# Patient Record
Sex: Female | Born: 1955 | Race: White | Hispanic: No | Marital: Married | Smoking: Never smoker
Health system: Southern US, Community
[De-identification: ages and names within clinical notes are randomized; demographics above are authoritative.]

## PROBLEM LIST (undated history)

## (undated) DIAGNOSIS — F32A Depression, unspecified: Secondary | ICD-10-CM

## (undated) HISTORY — PX: AUGMENTATION MAMMAPLASTY: SUR837

---

## 2006-01-11 ENCOUNTER — Other Ambulatory Visit: Admission: RE | Admit: 2006-01-11 | Discharge: 2006-01-11 | Payer: Self-pay | Admitting: Obstetrics and Gynecology

## 2006-01-25 ENCOUNTER — Ambulatory Visit (HOSPITAL_BASED_OUTPATIENT_CLINIC_OR_DEPARTMENT_OTHER): Admission: RE | Admit: 2006-01-25 | Discharge: 2006-01-25 | Payer: Self-pay | Admitting: Orthopedic Surgery

## 2007-04-27 ENCOUNTER — Other Ambulatory Visit: Admission: RE | Admit: 2007-04-27 | Discharge: 2007-04-27 | Payer: Self-pay | Admitting: Obstetrics and Gynecology

## 2007-05-15 ENCOUNTER — Encounter: Admission: RE | Admit: 2007-05-15 | Discharge: 2007-05-15 | Payer: Self-pay | Admitting: Obstetrics and Gynecology

## 2007-05-29 ENCOUNTER — Encounter: Admission: RE | Admit: 2007-05-29 | Discharge: 2007-05-29 | Payer: Self-pay | Admitting: Obstetrics and Gynecology

## 2008-06-04 ENCOUNTER — Encounter: Admission: RE | Admit: 2008-06-04 | Discharge: 2008-06-04 | Payer: Self-pay | Admitting: Obstetrics and Gynecology

## 2008-06-06 ENCOUNTER — Other Ambulatory Visit: Admission: RE | Admit: 2008-06-06 | Discharge: 2008-06-06 | Payer: Self-pay | Admitting: Obstetrics and Gynecology

## 2008-06-16 ENCOUNTER — Encounter: Admission: RE | Admit: 2008-06-16 | Discharge: 2008-06-16 | Payer: Self-pay | Admitting: Obstetrics and Gynecology

## 2009-08-04 ENCOUNTER — Encounter: Admission: RE | Admit: 2009-08-04 | Discharge: 2009-08-04 | Payer: Self-pay | Admitting: Obstetrics and Gynecology

## 2009-08-11 ENCOUNTER — Other Ambulatory Visit: Admission: RE | Admit: 2009-08-11 | Discharge: 2009-08-11 | Payer: Self-pay | Admitting: Obstetrics and Gynecology

## 2010-05-16 ENCOUNTER — Encounter: Payer: Self-pay | Admitting: Obstetrics and Gynecology

## 2010-08-18 ENCOUNTER — Other Ambulatory Visit (HOSPITAL_COMMUNITY)
Admission: RE | Admit: 2010-08-18 | Discharge: 2010-08-18 | Disposition: A | Discharge status: Home / Self Care | Payer: 59 | Source: Ambulatory Visit | Attending: Obstetrics and Gynecology | Admitting: Obstetrics and Gynecology

## 2010-08-18 ENCOUNTER — Other Ambulatory Visit: Payer: Self-pay | Admitting: Obstetrics and Gynecology

## 2010-08-18 DIAGNOSIS — Z01419 Encounter for gynecological examination (general) (routine) without abnormal findings: Secondary | ICD-10-CM | POA: Insufficient documentation

## 2010-09-07 ENCOUNTER — Other Ambulatory Visit: Payer: Self-pay | Admitting: Obstetrics and Gynecology

## 2010-09-07 DIAGNOSIS — Z1231 Encounter for screening mammogram for malignant neoplasm of breast: Secondary | ICD-10-CM

## 2010-09-10 ENCOUNTER — Ambulatory Visit
Admission: RE | Admit: 2010-09-10 | Discharge: 2010-09-10 | Disposition: A | Discharge status: Home / Self Care | Payer: 59 | Source: Ambulatory Visit | Attending: Obstetrics and Gynecology | Admitting: Obstetrics and Gynecology

## 2010-09-10 DIAGNOSIS — Z1231 Encounter for screening mammogram for malignant neoplasm of breast: Secondary | ICD-10-CM

## 2010-09-10 NOTE — Op Note (Signed)
Brittany Perry, Brittany Perry             ACCOUNT NO.:  [id number]   MEDICAL RECORD NO.:  [id number]          PATIENT TYPE:  AMB   LOCATION:  DSC                          FACILITY:  [facility]   PHYSICIAN:  Mila Homer. Sherlean Foot, M.D. DATE OF BIRTH:  25-Dec-1955   DATE OF PROCEDURE:  01/25/2006  DATE OF DISCHARGE:                                 OPERATIVE REPORT   PREOPERATIVE DIAGNOSIS:  Left shoulder impingement syndrome.   POSTOPERATIVE DIAGNOSIS:  Left shoulder impingement syndrome.   PROCEDURES:  Left shoulder arthroscopy, debridement of an anterior labral  tear, and biceps fraying, as well as a subacromial decompression.   SURGEON:  Mila Homer. Sherlean Foot, M.D.   ASSISTANT:  None.   ANESTHESIA:  General.   COMPLICATIONS:  None.   DRAINS:  None.   INDICATIONS FOR PROCEDURE:  The patient is a 55 year old white female with  failure of conservative measures for osteoarthritis of the knee.  Informed  consent was obtained.   DESCRIPTION OF PROCEDURE:  The patient was laid supine, administered general  anesthesia, and then placed in the beach-chair position.  The left shoulder  was prepped and draped in the usual sterile fashion in the beach-chair  position.  Anterior and posterior arthroscopic portals were created with a  #11 blade, blunt trocar and cannula.  Diagnostic arthroscopy of glenohumeral  joint revealed some anterior and superior labral fraying and tearing.  A  small Gator shaver was used through the anterior portal to debride this.  I  then went into the subacromial space through the posterior portal.  From the  direct lateral portal, I used a 4.4-mm cylindrical bur to perform an  aggressive anterolateral acromioplasty.  I did not violate the AC joint.  I  released the CA ligament with the Arthro Care debridement wand.  I performed  a bursectomy with the small Gator shaver.  This afforded excellent  decompression.  There was some small, dorsal-sided, partial-thickness  tearing of the  rotator cuff and a lot of hyperemia.  I then lavaged and  closed with 4-0 nylon sutures, dressed with Xeroform dressing, sterile  Webril and sterile ABDs and 2-inch silk tape and a simple sling.           ______________________________  Mila Homer. Sherlean Foot, M.D.     SDL/MEDQ  D:  01/25/2006  T:  01/25/2006  Job:  [id number]

## 2011-07-13 ENCOUNTER — Encounter (INDEPENDENT_AMBULATORY_CARE_PROVIDER_SITE_OTHER): Payer: 59 | Admitting: Ophthalmology

## 2011-09-06 ENCOUNTER — Other Ambulatory Visit (HOSPITAL_COMMUNITY)
Admission: RE | Admit: 2011-09-06 | Discharge: 2011-09-06 | Disposition: A | Discharge status: Home / Self Care | Payer: 59 | Source: Ambulatory Visit | Attending: Obstetrics and Gynecology | Admitting: Obstetrics and Gynecology

## 2011-09-06 ENCOUNTER — Other Ambulatory Visit: Payer: Self-pay | Admitting: Obstetrics and Gynecology

## 2011-09-06 DIAGNOSIS — Z01419 Encounter for gynecological examination (general) (routine) without abnormal findings: Secondary | ICD-10-CM | POA: Insufficient documentation

## 2011-12-30 ENCOUNTER — Other Ambulatory Visit: Payer: Self-pay | Admitting: Orthopedic Surgery

## 2011-12-30 DIAGNOSIS — R52 Pain, unspecified: Secondary | ICD-10-CM

## 2011-12-30 DIAGNOSIS — M25522 Pain in left elbow: Secondary | ICD-10-CM

## 2012-01-03 ENCOUNTER — Ambulatory Visit (INDEPENDENT_AMBULATORY_CARE_PROVIDER_SITE_OTHER): Payer: 59 | Admitting: Ophthalmology

## 2012-01-03 DIAGNOSIS — H3530 Unspecified macular degeneration: Secondary | ICD-10-CM

## 2012-01-03 DIAGNOSIS — H43819 Vitreous degeneration, unspecified eye: Secondary | ICD-10-CM

## 2012-01-03 DIAGNOSIS — H2510 Age-related nuclear cataract, unspecified eye: Secondary | ICD-10-CM

## 2012-01-05 ENCOUNTER — Ambulatory Visit
Admission: RE | Admit: 2012-01-05 | Discharge: 2012-01-05 | Disposition: A | Discharge status: Home / Self Care | Payer: 59 | Source: Ambulatory Visit | Attending: Orthopedic Surgery | Admitting: Orthopedic Surgery

## 2012-01-05 DIAGNOSIS — R52 Pain, unspecified: Secondary | ICD-10-CM

## 2012-01-05 DIAGNOSIS — M25522 Pain in left elbow: Secondary | ICD-10-CM

## 2012-01-05 MED ORDER — IOHEXOL 180 MG/ML  SOLN
5.0000 mL | Freq: Once | INTRAMUSCULAR | Status: AC | PRN
  Administered 2012-01-05: 5 mL via INTRA_ARTICULAR

## 2012-09-12 ENCOUNTER — Other Ambulatory Visit: Payer: Self-pay | Admitting: Obstetrics and Gynecology

## 2012-09-12 ENCOUNTER — Other Ambulatory Visit (HOSPITAL_COMMUNITY)
Admission: RE | Admit: 2012-09-12 | Discharge: 2012-09-12 | Disposition: A | Discharge status: Home / Self Care | Payer: 59 | Source: Ambulatory Visit | Attending: Obstetrics and Gynecology | Admitting: Obstetrics and Gynecology

## 2012-09-12 DIAGNOSIS — Z1151 Encounter for screening for human papillomavirus (HPV): Secondary | ICD-10-CM | POA: Insufficient documentation

## 2012-09-12 DIAGNOSIS — Z01419 Encounter for gynecological examination (general) (routine) without abnormal findings: Secondary | ICD-10-CM | POA: Insufficient documentation

## 2013-01-02 ENCOUNTER — Ambulatory Visit (INDEPENDENT_AMBULATORY_CARE_PROVIDER_SITE_OTHER): Payer: 59 | Admitting: Ophthalmology

## 2013-01-16 ENCOUNTER — Ambulatory Visit (INDEPENDENT_AMBULATORY_CARE_PROVIDER_SITE_OTHER): Payer: 59 | Admitting: Ophthalmology

## 2013-01-16 DIAGNOSIS — H2510 Age-related nuclear cataract, unspecified eye: Secondary | ICD-10-CM

## 2013-01-16 DIAGNOSIS — H3530 Unspecified macular degeneration: Secondary | ICD-10-CM

## 2013-01-16 DIAGNOSIS — H43819 Vitreous degeneration, unspecified eye: Secondary | ICD-10-CM

## 2013-02-21 ENCOUNTER — Other Ambulatory Visit: Payer: Self-pay

## 2013-02-21 DIAGNOSIS — Z1231 Encounter for screening mammogram for malignant neoplasm of breast: Secondary | ICD-10-CM

## 2013-03-18 ENCOUNTER — Other Ambulatory Visit: Payer: Self-pay | Admitting: Dermatology

## 2013-03-28 ENCOUNTER — Ambulatory Visit
Admission: RE | Admit: 2013-03-28 | Discharge: 2013-03-28 | Disposition: A | Discharge status: Home / Self Care | Payer: 59 | Source: Ambulatory Visit

## 2013-03-28 DIAGNOSIS — Z1231 Encounter for screening mammogram for malignant neoplasm of breast: Secondary | ICD-10-CM

## 2013-11-28 ENCOUNTER — Other Ambulatory Visit: Payer: Self-pay | Admitting: Internal Medicine

## 2013-11-28 DIAGNOSIS — Z1231 Encounter for screening mammogram for malignant neoplasm of breast: Secondary | ICD-10-CM

## 2014-01-23 ENCOUNTER — Ambulatory Visit (INDEPENDENT_AMBULATORY_CARE_PROVIDER_SITE_OTHER): Payer: 59 | Admitting: Ophthalmology

## 2014-03-12 ENCOUNTER — Ambulatory Visit (INDEPENDENT_AMBULATORY_CARE_PROVIDER_SITE_OTHER): Payer: 59 | Admitting: Ophthalmology

## 2014-03-12 DIAGNOSIS — H3531 Nonexudative age-related macular degeneration: Secondary | ICD-10-CM

## 2014-03-12 DIAGNOSIS — H43813 Vitreous degeneration, bilateral: Secondary | ICD-10-CM

## 2014-03-26 ENCOUNTER — Other Ambulatory Visit: Payer: Self-pay | Admitting: Obstetrics and Gynecology

## 2014-03-26 ENCOUNTER — Other Ambulatory Visit (HOSPITAL_COMMUNITY)
Admission: RE | Admit: 2014-03-26 | Discharge: 2014-03-26 | Disposition: A | Discharge status: Home / Self Care | Payer: 59 | Source: Ambulatory Visit | Attending: Obstetrics and Gynecology | Admitting: Obstetrics and Gynecology

## 2014-03-26 DIAGNOSIS — Z01419 Encounter for gynecological examination (general) (routine) without abnormal findings: Secondary | ICD-10-CM | POA: Insufficient documentation

## 2014-03-28 LAB — CYTOLOGY - PAP

## 2014-04-01 ENCOUNTER — Ambulatory Visit
Admission: RE | Admit: 2014-04-01 | Discharge: 2014-04-01 | Disposition: A | Discharge status: Home / Self Care | Payer: 59 | Source: Ambulatory Visit | Attending: Internal Medicine | Admitting: Internal Medicine

## 2014-04-01 DIAGNOSIS — Z1231 Encounter for screening mammogram for malignant neoplasm of breast: Secondary | ICD-10-CM

## 2015-03-05 ENCOUNTER — Other Ambulatory Visit: Payer: Self-pay

## 2015-03-05 DIAGNOSIS — Z1231 Encounter for screening mammogram for malignant neoplasm of breast: Secondary | ICD-10-CM

## 2015-03-05 DIAGNOSIS — Z9882 Breast implant status: Secondary | ICD-10-CM

## 2015-03-13 ENCOUNTER — Ambulatory Visit (INDEPENDENT_AMBULATORY_CARE_PROVIDER_SITE_OTHER): Payer: 59 | Admitting: Ophthalmology

## 2015-03-26 ENCOUNTER — Ambulatory Visit (INDEPENDENT_AMBULATORY_CARE_PROVIDER_SITE_OTHER): Payer: BLUE CROSS/BLUE SHIELD

## 2015-03-26 ENCOUNTER — Ambulatory Visit: Payer: BLUE CROSS/BLUE SHIELD

## 2015-03-26 ENCOUNTER — Ambulatory Visit (INDEPENDENT_AMBULATORY_CARE_PROVIDER_SITE_OTHER): Payer: BLUE CROSS/BLUE SHIELD | Admitting: Podiatry

## 2015-03-26 VITALS — BP 115/60 | HR 77 | Resp 16 | Ht 62.0 in | Wt 120.0 lb

## 2015-03-26 DIAGNOSIS — M722 Plantar fascial fibromatosis: Secondary | ICD-10-CM | POA: Diagnosis not present

## 2015-03-26 DIAGNOSIS — M79671 Pain in right foot: Secondary | ICD-10-CM | POA: Diagnosis not present

## 2015-03-26 DIAGNOSIS — M79672 Pain in left foot: Secondary | ICD-10-CM

## 2015-03-26 MED ORDER — TRIAMCINOLONE ACETONIDE 10 MG/ML IJ SUSP
10.0000 mg | Freq: Once | INTRAMUSCULAR | Status: AC
  Administered 2015-03-26: 10 mg

## 2015-03-26 MED ORDER — MELOXICAM 15 MG PO TABS: 15.0000 mg | ORAL_TABLET | Freq: Every day | ORAL | Status: DC

## 2015-03-26 NOTE — Patient Instructions (Signed)

## 2015-03-26 NOTE — Progress Notes (Signed)
   Subjective:    Patient ID: Brittany PushPatricia Perry, female    DOB: 1955-07-02, 59 y.o.   MRN: [id number]  HPI Patient presents with foot pain in their right foot, heel. This has been going on for the past 2 months.   Review of Systems  All other systems reviewed and are negative.      Objective:   Physical Exam        Assessment & Plan:

## 2015-03-27 NOTE — Progress Notes (Signed)
Subjective:     Patient ID: Brittany PushPatricia Perry, female   DOB: [birth year], 59 y.o.   MRN: [id number]  HPI patient states my right heel has been bothering me for the last several months and making it hard for me to walk comfortably or be active   Review of Systems  All other systems reviewed and are negative.      Objective:   Physical Exam  Constitutional: She is oriented to person, place, and time.  Cardiovascular: Intact distal pulses.   Musculoskeletal: Normal range of motion.  Neurological: She is oriented to person, place, and time.  Skin: Skin is warm.  Nursing note and vitals reviewed.  neurovascular status intact muscle strength adequate range of motion within normal limits with patient noted to have exquisite discomfort plantar aspect right at the insertional point tendon into the calcaneus with fluid buildup around the medial band. Patient also has moderate depression of the arch noted is found to have good digital perfusion and is well oriented 3     Assessment:     Inflammatory fasciitis insertional point calcaneus to the heel    Plan:     H&P and condition reviewed with patient and x-rays reviewed of both feet indicating spur formation. Injected the right plantar fascia 3 mg Kenalog 5 mill grams Xylocaine and applied fascial brace and instructed on physical therapy and shoe gear modifications. Discussed long-term orthotics which we will decide on based on response

## 2015-03-31 ENCOUNTER — Other Ambulatory Visit (HOSPITAL_COMMUNITY)
Admission: RE | Admit: 2015-03-31 | Discharge: 2015-03-31 | Disposition: A | Discharge status: Home / Self Care | Payer: BLUE CROSS/BLUE SHIELD | Source: Ambulatory Visit | Attending: Obstetrics and Gynecology | Admitting: Obstetrics and Gynecology

## 2015-03-31 ENCOUNTER — Other Ambulatory Visit: Payer: Self-pay | Admitting: Obstetrics and Gynecology

## 2015-03-31 DIAGNOSIS — Z01419 Encounter for gynecological examination (general) (routine) without abnormal findings: Secondary | ICD-10-CM | POA: Insufficient documentation

## 2015-04-01 LAB — CYTOLOGY - PAP

## 2015-04-02 ENCOUNTER — Ambulatory Visit (INDEPENDENT_AMBULATORY_CARE_PROVIDER_SITE_OTHER): Payer: BLUE CROSS/BLUE SHIELD | Admitting: Podiatry

## 2015-04-02 DIAGNOSIS — M722 Plantar fascial fibromatosis: Secondary | ICD-10-CM

## 2015-04-03 NOTE — Progress Notes (Signed)
Subjective:     Patient ID: Brittany Perry, female   DOB: May 28, 1955, 59 y.o.   MRN: [id number]  HPI patient states my heel feels significantly better with diminishment of discomfort and still some swelling if him on it too much and also still have depression of my arch   Review of Systems     Objective:   Physical Exam Neurovascular status intact muscle strength adequate with moderate discomfort plantar fascia a deep insertion to the calcaneus with depression of the arch noted that was helped with fascial brace    Assessment:     Fasciitis that is improvement with still some depression of arch noted    Plan:     Reviewed conditions and at this time scanned for custom Berkley type orthotics to give complete support to the plantar arch and stabilization of the heel. Patient be seen back when those are ready and we'll continue physical therapy stretching exercises and shoe gear modifications

## 2015-04-09 ENCOUNTER — Ambulatory Visit
Admission: RE | Admit: 2015-04-09 | Discharge: 2015-04-09 | Disposition: A | Discharge status: Home / Self Care | Payer: BLUE CROSS/BLUE SHIELD | Source: Ambulatory Visit

## 2015-04-09 DIAGNOSIS — Z9882 Breast implant status: Secondary | ICD-10-CM

## 2015-04-09 DIAGNOSIS — Z1231 Encounter for screening mammogram for malignant neoplasm of breast: Secondary | ICD-10-CM

## 2015-08-03 ENCOUNTER — Ambulatory Visit (INDEPENDENT_AMBULATORY_CARE_PROVIDER_SITE_OTHER): Payer: BLUE CROSS/BLUE SHIELD | Admitting: Podiatry

## 2015-08-03 VITALS — BP 134/70 | HR 76 | Resp 12

## 2015-08-03 DIAGNOSIS — M722 Plantar fascial fibromatosis: Secondary | ICD-10-CM | POA: Diagnosis not present

## 2015-08-03 MED ORDER — TRIAMCINOLONE ACETONIDE 10 MG/ML IJ SUSP
10.0000 mg | Freq: Once | INTRAMUSCULAR | Status: AC
  Administered 2015-08-03: 10 mg

## 2015-08-05 NOTE — Progress Notes (Signed)
Subjective:     Patient ID: Brittany PushPatricia Perry, female   DOB: 13-Mar-1956, 60 y.o.   MRN: [id number]  HPI patient states my heel has been hurting again and I need an injection   Review of Systems     Objective:   Physical Exam Neurovascular status intact muscle strength adequate with continued discomfort plantar aspect right heel    Assessment:     Continued acute plantar fasciitis right    Plan:     Reinjected the plantar fascial right 3 [place]igan Kenalog 5 mill grams Xylocaine advised on physical therapy supportive shoes and reappoint to recheck

## 2016-02-15 DIAGNOSIS — Z0000 Encounter for general adult medical examination without abnormal findings: Secondary | ICD-10-CM | POA: Diagnosis not present

## 2016-02-15 DIAGNOSIS — R8299 Other abnormal findings in urine: Secondary | ICD-10-CM | POA: Diagnosis not present

## 2016-02-22 DIAGNOSIS — E784 Other hyperlipidemia: Secondary | ICD-10-CM | POA: Diagnosis not present

## 2016-02-22 DIAGNOSIS — Z1389 Encounter for screening for other disorder: Secondary | ICD-10-CM | POA: Diagnosis not present

## 2016-02-22 DIAGNOSIS — Z0000 Encounter for general adult medical examination without abnormal findings: Secondary | ICD-10-CM | POA: Diagnosis not present

## 2016-02-22 DIAGNOSIS — Z23 Encounter for immunization: Secondary | ICD-10-CM | POA: Diagnosis not present

## 2016-02-22 DIAGNOSIS — R945 Abnormal results of liver function studies: Secondary | ICD-10-CM | POA: Diagnosis not present

## 2016-03-08 DIAGNOSIS — Z85828 Personal history of other malignant neoplasm of skin: Secondary | ICD-10-CM | POA: Diagnosis not present

## 2016-03-08 DIAGNOSIS — D485 Neoplasm of uncertain behavior of skin: Secondary | ICD-10-CM | POA: Diagnosis not present

## 2016-03-08 DIAGNOSIS — L570 Actinic keratosis: Secondary | ICD-10-CM | POA: Diagnosis not present

## 2016-03-08 DIAGNOSIS — L239 Allergic contact dermatitis, unspecified cause: Secondary | ICD-10-CM | POA: Diagnosis not present

## 2016-03-08 DIAGNOSIS — L814 Other melanin hyperpigmentation: Secondary | ICD-10-CM | POA: Diagnosis not present

## 2016-03-08 DIAGNOSIS — I788 Other diseases of capillaries: Secondary | ICD-10-CM | POA: Diagnosis not present

## 2016-03-31 ENCOUNTER — Other Ambulatory Visit: Payer: Self-pay | Admitting: Obstetrics and Gynecology

## 2016-03-31 ENCOUNTER — Other Ambulatory Visit (HOSPITAL_COMMUNITY)
Admission: RE | Admit: 2016-03-31 | Discharge: 2016-03-31 | Disposition: A | Discharge status: Home / Self Care | Payer: BLUE CROSS/BLUE SHIELD | Source: Ambulatory Visit | Attending: Obstetrics and Gynecology | Admitting: Obstetrics and Gynecology

## 2016-03-31 DIAGNOSIS — Z01419 Encounter for gynecological examination (general) (routine) without abnormal findings: Secondary | ICD-10-CM | POA: Diagnosis not present

## 2016-03-31 DIAGNOSIS — Z1151 Encounter for screening for human papillomavirus (HPV): Secondary | ICD-10-CM | POA: Diagnosis not present

## 2016-04-05 LAB — CYTOLOGY - PAP
Diagnosis: NEGATIVE
HPV: NOT DETECTED

## 2016-06-02 ENCOUNTER — Other Ambulatory Visit: Payer: Self-pay | Admitting: Obstetrics and Gynecology

## 2016-06-02 DIAGNOSIS — Z1231 Encounter for screening mammogram for malignant neoplasm of breast: Secondary | ICD-10-CM

## 2016-06-10 ENCOUNTER — Ambulatory Visit: Payer: BLUE CROSS/BLUE SHIELD

## 2016-07-08 ENCOUNTER — Ambulatory Visit
Admission: RE | Admit: 2016-07-08 | Discharge: 2016-07-08 | Disposition: A | Discharge status: Home / Self Care | Payer: BLUE CROSS/BLUE SHIELD | Source: Ambulatory Visit | Attending: Obstetrics and Gynecology | Admitting: Obstetrics and Gynecology

## 2016-07-08 DIAGNOSIS — Z1231 Encounter for screening mammogram for malignant neoplasm of breast: Secondary | ICD-10-CM

## 2016-11-24 DIAGNOSIS — M25471 Effusion, right ankle: Secondary | ICD-10-CM | POA: Diagnosis not present

## 2016-11-24 DIAGNOSIS — S82831A Other fracture of upper and lower end of right fibula, initial encounter for closed fracture: Secondary | ICD-10-CM | POA: Diagnosis not present

## 2016-11-24 DIAGNOSIS — M25571 Pain in right ankle and joints of right foot: Secondary | ICD-10-CM | POA: Diagnosis not present

## 2016-11-30 DIAGNOSIS — L820 Inflamed seborrheic keratosis: Secondary | ICD-10-CM | POA: Diagnosis not present

## 2016-11-30 DIAGNOSIS — Z85828 Personal history of other malignant neoplasm of skin: Secondary | ICD-10-CM | POA: Diagnosis not present

## 2016-11-30 DIAGNOSIS — L570 Actinic keratosis: Secondary | ICD-10-CM | POA: Diagnosis not present

## 2016-11-30 DIAGNOSIS — L821 Other seborrheic keratosis: Secondary | ICD-10-CM | POA: Diagnosis not present

## 2016-12-06 DIAGNOSIS — S8290XA Unspecified fracture of unspecified lower leg, initial encounter for closed fracture: Secondary | ICD-10-CM | POA: Diagnosis not present

## 2016-12-09 DIAGNOSIS — S82831D Other fracture of upper and lower end of right fibula, subsequent encounter for closed fracture with routine healing: Secondary | ICD-10-CM | POA: Diagnosis not present

## 2016-12-09 DIAGNOSIS — R936 Abnormal findings on diagnostic imaging of limbs: Secondary | ICD-10-CM | POA: Diagnosis not present

## 2016-12-09 DIAGNOSIS — M25571 Pain in right ankle and joints of right foot: Secondary | ICD-10-CM | POA: Diagnosis not present

## 2017-01-02 ENCOUNTER — Encounter (INDEPENDENT_AMBULATORY_CARE_PROVIDER_SITE_OTHER): Payer: Self-pay | Admitting: Ophthalmology

## 2017-01-02 ENCOUNTER — Encounter (INDEPENDENT_AMBULATORY_CARE_PROVIDER_SITE_OTHER): Payer: BLUE CROSS/BLUE SHIELD | Admitting: Ophthalmology

## 2017-01-02 DIAGNOSIS — H2513 Age-related nuclear cataract, bilateral: Secondary | ICD-10-CM | POA: Diagnosis not present

## 2017-01-02 DIAGNOSIS — H43813 Vitreous degeneration, bilateral: Secondary | ICD-10-CM

## 2017-01-02 DIAGNOSIS — H353132 Nonexudative age-related macular degeneration, bilateral, intermediate dry stage: Secondary | ICD-10-CM

## 2017-01-25 DIAGNOSIS — H353112 Nonexudative age-related macular degeneration, right eye, intermediate dry stage: Secondary | ICD-10-CM | POA: Diagnosis not present

## 2017-01-25 DIAGNOSIS — H2511 Age-related nuclear cataract, right eye: Secondary | ICD-10-CM | POA: Diagnosis not present

## 2017-01-25 DIAGNOSIS — H43812 Vitreous degeneration, left eye: Secondary | ICD-10-CM | POA: Diagnosis not present

## 2017-01-25 DIAGNOSIS — H353122 Nonexudative age-related macular degeneration, left eye, intermediate dry stage: Secondary | ICD-10-CM | POA: Diagnosis not present

## 2017-01-25 DIAGNOSIS — H25013 Cortical age-related cataract, bilateral: Secondary | ICD-10-CM | POA: Diagnosis not present

## 2017-01-25 DIAGNOSIS — H25011 Cortical age-related cataract, right eye: Secondary | ICD-10-CM | POA: Diagnosis not present

## 2017-01-25 DIAGNOSIS — H353132 Nonexudative age-related macular degeneration, bilateral, intermediate dry stage: Secondary | ICD-10-CM | POA: Diagnosis not present

## 2017-01-25 DIAGNOSIS — H2513 Age-related nuclear cataract, bilateral: Secondary | ICD-10-CM | POA: Diagnosis not present

## 2017-02-20 DIAGNOSIS — E7849 Other hyperlipidemia: Secondary | ICD-10-CM | POA: Diagnosis not present

## 2017-02-20 DIAGNOSIS — Z0000 Encounter for general adult medical examination without abnormal findings: Secondary | ICD-10-CM | POA: Diagnosis not present

## 2017-02-27 DIAGNOSIS — Z0000 Encounter for general adult medical examination without abnormal findings: Secondary | ICD-10-CM | POA: Diagnosis not present

## 2017-02-27 DIAGNOSIS — R945 Abnormal results of liver function studies: Secondary | ICD-10-CM | POA: Diagnosis not present

## 2017-02-27 DIAGNOSIS — Z23 Encounter for immunization: Secondary | ICD-10-CM | POA: Diagnosis not present

## 2017-02-27 DIAGNOSIS — Z1389 Encounter for screening for other disorder: Secondary | ICD-10-CM | POA: Diagnosis not present

## 2017-02-27 DIAGNOSIS — E7849 Other hyperlipidemia: Secondary | ICD-10-CM | POA: Diagnosis not present

## 2017-02-27 DIAGNOSIS — Z6821 Body mass index (BMI) 21.0-21.9, adult: Secondary | ICD-10-CM | POA: Diagnosis not present

## 2017-03-14 DIAGNOSIS — H25811 Combined forms of age-related cataract, right eye: Secondary | ICD-10-CM | POA: Diagnosis not present

## 2017-03-14 DIAGNOSIS — H2511 Age-related nuclear cataract, right eye: Secondary | ICD-10-CM | POA: Diagnosis not present

## 2017-03-21 DIAGNOSIS — H2513 Age-related nuclear cataract, bilateral: Secondary | ICD-10-CM | POA: Diagnosis not present

## 2017-04-06 DIAGNOSIS — H2512 Age-related nuclear cataract, left eye: Secondary | ICD-10-CM | POA: Diagnosis not present

## 2017-04-06 DIAGNOSIS — H25012 Cortical age-related cataract, left eye: Secondary | ICD-10-CM | POA: Diagnosis not present

## 2017-04-11 DIAGNOSIS — H2512 Age-related nuclear cataract, left eye: Secondary | ICD-10-CM | POA: Diagnosis not present

## 2017-04-11 DIAGNOSIS — H25812 Combined forms of age-related cataract, left eye: Secondary | ICD-10-CM | POA: Diagnosis not present

## 2017-04-19 DIAGNOSIS — H2513 Age-related nuclear cataract, bilateral: Secondary | ICD-10-CM | POA: Diagnosis not present

## 2017-04-19 DIAGNOSIS — D2371 Other benign neoplasm of skin of right lower limb, including hip: Secondary | ICD-10-CM | POA: Diagnosis not present

## 2017-04-19 DIAGNOSIS — L821 Other seborrheic keratosis: Secondary | ICD-10-CM | POA: Diagnosis not present

## 2017-04-19 DIAGNOSIS — D1801 Hemangioma of skin and subcutaneous tissue: Secondary | ICD-10-CM | POA: Diagnosis not present

## 2017-04-19 DIAGNOSIS — L570 Actinic keratosis: Secondary | ICD-10-CM | POA: Diagnosis not present

## 2017-04-19 DIAGNOSIS — Z85828 Personal history of other malignant neoplasm of skin: Secondary | ICD-10-CM | POA: Diagnosis not present

## 2017-05-24 DIAGNOSIS — Z01411 Encounter for gynecological examination (general) (routine) with abnormal findings: Secondary | ICD-10-CM | POA: Diagnosis not present

## 2017-06-20 ENCOUNTER — Other Ambulatory Visit: Payer: Self-pay | Admitting: Obstetrics and Gynecology

## 2017-06-20 DIAGNOSIS — Z1231 Encounter for screening mammogram for malignant neoplasm of breast: Secondary | ICD-10-CM

## 2017-07-10 ENCOUNTER — Ambulatory Visit
Admission: RE | Admit: 2017-07-10 | Discharge: 2017-07-10 | Disposition: A | Discharge status: Home / Self Care | Payer: BLUE CROSS/BLUE SHIELD | Source: Ambulatory Visit | Attending: Obstetrics and Gynecology | Admitting: Obstetrics and Gynecology

## 2017-07-10 DIAGNOSIS — Z1231 Encounter for screening mammogram for malignant neoplasm of breast: Secondary | ICD-10-CM

## 2018-01-02 ENCOUNTER — Encounter (INDEPENDENT_AMBULATORY_CARE_PROVIDER_SITE_OTHER): Payer: BLUE CROSS/BLUE SHIELD | Admitting: Ophthalmology

## 2018-05-28 DIAGNOSIS — Z01419 Encounter for gynecological examination (general) (routine) without abnormal findings: Secondary | ICD-10-CM | POA: Diagnosis not present

## 2018-05-31 DIAGNOSIS — M545 Low back pain: Secondary | ICD-10-CM | POA: Diagnosis not present

## 2018-06-18 DIAGNOSIS — M545 Low back pain: Secondary | ICD-10-CM | POA: Diagnosis not present

## 2018-06-25 DIAGNOSIS — M545 Low back pain: Secondary | ICD-10-CM | POA: Diagnosis not present

## 2018-06-25 DIAGNOSIS — E7849 Other hyperlipidemia: Secondary | ICD-10-CM | POA: Diagnosis not present

## 2018-06-25 DIAGNOSIS — Z0000 Encounter for general adult medical examination without abnormal findings: Secondary | ICD-10-CM | POA: Diagnosis not present

## 2018-06-27 DIAGNOSIS — Z1331 Encounter for screening for depression: Secondary | ICD-10-CM | POA: Diagnosis not present

## 2018-06-27 DIAGNOSIS — R945 Abnormal results of liver function studies: Secondary | ICD-10-CM | POA: Diagnosis not present

## 2018-06-27 DIAGNOSIS — E785 Hyperlipidemia, unspecified: Secondary | ICD-10-CM | POA: Diagnosis not present

## 2018-06-27 DIAGNOSIS — Z23 Encounter for immunization: Secondary | ICD-10-CM | POA: Diagnosis not present

## 2018-06-27 DIAGNOSIS — Z0000 Encounter for general adult medical examination without abnormal findings: Secondary | ICD-10-CM | POA: Diagnosis not present

## 2018-06-27 DIAGNOSIS — M545 Low back pain: Secondary | ICD-10-CM | POA: Diagnosis not present

## 2018-07-03 DIAGNOSIS — M545 Low back pain: Secondary | ICD-10-CM | POA: Diagnosis not present

## 2018-07-09 DIAGNOSIS — M545 Low back pain: Secondary | ICD-10-CM | POA: Diagnosis not present

## 2018-08-15 DIAGNOSIS — Z85828 Personal history of other malignant neoplasm of skin: Secondary | ICD-10-CM | POA: Diagnosis not present

## 2018-08-15 DIAGNOSIS — D485 Neoplasm of uncertain behavior of skin: Secondary | ICD-10-CM | POA: Diagnosis not present

## 2018-08-15 DIAGNOSIS — C44619 Basal cell carcinoma of skin of left upper limb, including shoulder: Secondary | ICD-10-CM | POA: Diagnosis not present

## 2018-08-15 DIAGNOSIS — L821 Other seborrheic keratosis: Secondary | ICD-10-CM | POA: Diagnosis not present

## 2018-08-15 DIAGNOSIS — L570 Actinic keratosis: Secondary | ICD-10-CM | POA: Diagnosis not present

## 2018-11-09 DIAGNOSIS — M545 Low back pain: Secondary | ICD-10-CM | POA: Diagnosis not present

## 2018-11-19 DIAGNOSIS — M545 Low back pain: Secondary | ICD-10-CM | POA: Diagnosis not present

## 2018-11-23 DIAGNOSIS — E860 Dehydration: Secondary | ICD-10-CM | POA: Diagnosis not present

## 2018-11-23 DIAGNOSIS — R42 Dizziness and giddiness: Secondary | ICD-10-CM | POA: Diagnosis not present

## 2018-11-23 DIAGNOSIS — R0789 Other chest pain: Secondary | ICD-10-CM | POA: Diagnosis not present

## 2019-02-20 ENCOUNTER — Other Ambulatory Visit: Payer: Self-pay | Admitting: Obstetrics and Gynecology

## 2019-02-20 DIAGNOSIS — Z1231 Encounter for screening mammogram for malignant neoplasm of breast: Secondary | ICD-10-CM

## 2019-04-10 ENCOUNTER — Ambulatory Visit
Admission: RE | Admit: 2019-04-10 | Discharge: 2019-04-10 | Disposition: A | Discharge status: Home / Self Care | Payer: BLUE CROSS/BLUE SHIELD | Source: Ambulatory Visit | Attending: Obstetrics and Gynecology | Admitting: Obstetrics and Gynecology

## 2019-04-10 ENCOUNTER — Other Ambulatory Visit: Payer: Self-pay

## 2019-04-10 DIAGNOSIS — Z1231 Encounter for screening mammogram for malignant neoplasm of breast: Secondary | ICD-10-CM

## 2019-06-07 DIAGNOSIS — Z20828 Contact with and (suspected) exposure to other viral communicable diseases: Secondary | ICD-10-CM | POA: Diagnosis not present

## 2019-06-16 DIAGNOSIS — Z20828 Contact with and (suspected) exposure to other viral communicable diseases: Secondary | ICD-10-CM | POA: Diagnosis not present

## 2019-06-26 DIAGNOSIS — Z0000 Encounter for general adult medical examination without abnormal findings: Secondary | ICD-10-CM | POA: Diagnosis not present

## 2019-06-26 DIAGNOSIS — E7849 Other hyperlipidemia: Secondary | ICD-10-CM | POA: Diagnosis not present

## 2019-06-27 DIAGNOSIS — R82998 Other abnormal findings in urine: Secondary | ICD-10-CM | POA: Diagnosis not present

## 2019-07-03 DIAGNOSIS — F329 Major depressive disorder, single episode, unspecified: Secondary | ICD-10-CM | POA: Diagnosis not present

## 2019-07-03 DIAGNOSIS — Z1331 Encounter for screening for depression: Secondary | ICD-10-CM | POA: Diagnosis not present

## 2019-07-03 DIAGNOSIS — Z0000 Encounter for general adult medical examination without abnormal findings: Secondary | ICD-10-CM | POA: Diagnosis not present

## 2019-07-03 DIAGNOSIS — E785 Hyperlipidemia, unspecified: Secondary | ICD-10-CM | POA: Diagnosis not present

## 2019-07-18 DIAGNOSIS — Z23 Encounter for immunization: Secondary | ICD-10-CM | POA: Diagnosis not present

## 2019-08-14 DIAGNOSIS — Z23 Encounter for immunization: Secondary | ICD-10-CM | POA: Diagnosis not present

## 2019-08-15 ENCOUNTER — Other Ambulatory Visit: Payer: Self-pay | Admitting: Obstetrics and Gynecology

## 2019-08-15 DIAGNOSIS — Z01419 Encounter for gynecological examination (general) (routine) without abnormal findings: Secondary | ICD-10-CM | POA: Diagnosis not present

## 2019-08-15 DIAGNOSIS — N904 Leukoplakia of vulva: Secondary | ICD-10-CM | POA: Diagnosis not present

## 2019-08-15 DIAGNOSIS — N905 Atrophy of vulva: Secondary | ICD-10-CM | POA: Diagnosis not present

## 2020-03-04 ENCOUNTER — Other Ambulatory Visit: Payer: Self-pay | Admitting: Obstetrics and Gynecology

## 2020-03-04 DIAGNOSIS — Z0000 Encounter for general adult medical examination without abnormal findings: Secondary | ICD-10-CM

## 2020-03-05 DIAGNOSIS — M9902 Segmental and somatic dysfunction of thoracic region: Secondary | ICD-10-CM | POA: Diagnosis not present

## 2020-03-05 DIAGNOSIS — M62830 Muscle spasm of back: Secondary | ICD-10-CM | POA: Diagnosis not present

## 2020-03-05 DIAGNOSIS — M9901 Segmental and somatic dysfunction of cervical region: Secondary | ICD-10-CM | POA: Diagnosis not present

## 2020-03-05 DIAGNOSIS — M546 Pain in thoracic spine: Secondary | ICD-10-CM | POA: Diagnosis not present

## 2020-03-09 DIAGNOSIS — M9901 Segmental and somatic dysfunction of cervical region: Secondary | ICD-10-CM | POA: Diagnosis not present

## 2020-03-09 DIAGNOSIS — M546 Pain in thoracic spine: Secondary | ICD-10-CM | POA: Diagnosis not present

## 2020-03-09 DIAGNOSIS — M9902 Segmental and somatic dysfunction of thoracic region: Secondary | ICD-10-CM | POA: Diagnosis not present

## 2020-03-09 DIAGNOSIS — M62830 Muscle spasm of back: Secondary | ICD-10-CM | POA: Diagnosis not present

## 2020-03-10 DIAGNOSIS — M62830 Muscle spasm of back: Secondary | ICD-10-CM | POA: Diagnosis not present

## 2020-03-10 DIAGNOSIS — M9901 Segmental and somatic dysfunction of cervical region: Secondary | ICD-10-CM | POA: Diagnosis not present

## 2020-03-10 DIAGNOSIS — M546 Pain in thoracic spine: Secondary | ICD-10-CM | POA: Diagnosis not present

## 2020-03-10 DIAGNOSIS — M9902 Segmental and somatic dysfunction of thoracic region: Secondary | ICD-10-CM | POA: Diagnosis not present

## 2020-03-12 DIAGNOSIS — M546 Pain in thoracic spine: Secondary | ICD-10-CM | POA: Diagnosis not present

## 2020-03-12 DIAGNOSIS — M9901 Segmental and somatic dysfunction of cervical region: Secondary | ICD-10-CM | POA: Diagnosis not present

## 2020-03-12 DIAGNOSIS — M62830 Muscle spasm of back: Secondary | ICD-10-CM | POA: Diagnosis not present

## 2020-03-12 DIAGNOSIS — M9902 Segmental and somatic dysfunction of thoracic region: Secondary | ICD-10-CM | POA: Diagnosis not present

## 2020-03-16 DIAGNOSIS — M546 Pain in thoracic spine: Secondary | ICD-10-CM | POA: Diagnosis not present

## 2020-03-16 DIAGNOSIS — M62830 Muscle spasm of back: Secondary | ICD-10-CM | POA: Diagnosis not present

## 2020-03-16 DIAGNOSIS — M9901 Segmental and somatic dysfunction of cervical region: Secondary | ICD-10-CM | POA: Diagnosis not present

## 2020-03-16 DIAGNOSIS — M9902 Segmental and somatic dysfunction of thoracic region: Secondary | ICD-10-CM | POA: Diagnosis not present

## 2020-03-17 DIAGNOSIS — M9901 Segmental and somatic dysfunction of cervical region: Secondary | ICD-10-CM | POA: Diagnosis not present

## 2020-03-17 DIAGNOSIS — M546 Pain in thoracic spine: Secondary | ICD-10-CM | POA: Diagnosis not present

## 2020-03-17 DIAGNOSIS — M9902 Segmental and somatic dysfunction of thoracic region: Secondary | ICD-10-CM | POA: Diagnosis not present

## 2020-03-17 DIAGNOSIS — M62830 Muscle spasm of back: Secondary | ICD-10-CM | POA: Diagnosis not present

## 2020-03-18 DIAGNOSIS — M62830 Muscle spasm of back: Secondary | ICD-10-CM | POA: Diagnosis not present

## 2020-03-18 DIAGNOSIS — M9901 Segmental and somatic dysfunction of cervical region: Secondary | ICD-10-CM | POA: Diagnosis not present

## 2020-03-18 DIAGNOSIS — M546 Pain in thoracic spine: Secondary | ICD-10-CM | POA: Diagnosis not present

## 2020-03-18 DIAGNOSIS — M9902 Segmental and somatic dysfunction of thoracic region: Secondary | ICD-10-CM | POA: Diagnosis not present

## 2020-03-23 DIAGNOSIS — M9901 Segmental and somatic dysfunction of cervical region: Secondary | ICD-10-CM | POA: Diagnosis not present

## 2020-03-23 DIAGNOSIS — M546 Pain in thoracic spine: Secondary | ICD-10-CM | POA: Diagnosis not present

## 2020-03-23 DIAGNOSIS — M9902 Segmental and somatic dysfunction of thoracic region: Secondary | ICD-10-CM | POA: Diagnosis not present

## 2020-03-23 DIAGNOSIS — M62830 Muscle spasm of back: Secondary | ICD-10-CM | POA: Diagnosis not present

## 2020-03-24 DIAGNOSIS — M9902 Segmental and somatic dysfunction of thoracic region: Secondary | ICD-10-CM | POA: Diagnosis not present

## 2020-03-24 DIAGNOSIS — M9901 Segmental and somatic dysfunction of cervical region: Secondary | ICD-10-CM | POA: Diagnosis not present

## 2020-03-24 DIAGNOSIS — M62830 Muscle spasm of back: Secondary | ICD-10-CM | POA: Diagnosis not present

## 2020-03-24 DIAGNOSIS — M546 Pain in thoracic spine: Secondary | ICD-10-CM | POA: Diagnosis not present

## 2020-03-26 DIAGNOSIS — M62830 Muscle spasm of back: Secondary | ICD-10-CM | POA: Diagnosis not present

## 2020-03-26 DIAGNOSIS — M546 Pain in thoracic spine: Secondary | ICD-10-CM | POA: Diagnosis not present

## 2020-03-26 DIAGNOSIS — M9901 Segmental and somatic dysfunction of cervical region: Secondary | ICD-10-CM | POA: Diagnosis not present

## 2020-03-26 DIAGNOSIS — M9902 Segmental and somatic dysfunction of thoracic region: Secondary | ICD-10-CM | POA: Diagnosis not present

## 2020-03-31 DIAGNOSIS — M9902 Segmental and somatic dysfunction of thoracic region: Secondary | ICD-10-CM | POA: Diagnosis not present

## 2020-03-31 DIAGNOSIS — M62830 Muscle spasm of back: Secondary | ICD-10-CM | POA: Diagnosis not present

## 2020-03-31 DIAGNOSIS — M9901 Segmental and somatic dysfunction of cervical region: Secondary | ICD-10-CM | POA: Diagnosis not present

## 2020-03-31 DIAGNOSIS — M546 Pain in thoracic spine: Secondary | ICD-10-CM | POA: Diagnosis not present

## 2020-04-02 DIAGNOSIS — M9901 Segmental and somatic dysfunction of cervical region: Secondary | ICD-10-CM | POA: Diagnosis not present

## 2020-04-02 DIAGNOSIS — M62830 Muscle spasm of back: Secondary | ICD-10-CM | POA: Diagnosis not present

## 2020-04-02 DIAGNOSIS — M546 Pain in thoracic spine: Secondary | ICD-10-CM | POA: Diagnosis not present

## 2020-04-02 DIAGNOSIS — M7712 Lateral epicondylitis, left elbow: Secondary | ICD-10-CM | POA: Diagnosis not present

## 2020-04-02 DIAGNOSIS — M9902 Segmental and somatic dysfunction of thoracic region: Secondary | ICD-10-CM | POA: Diagnosis not present

## 2020-04-07 DIAGNOSIS — M9902 Segmental and somatic dysfunction of thoracic region: Secondary | ICD-10-CM | POA: Diagnosis not present

## 2020-04-07 DIAGNOSIS — M546 Pain in thoracic spine: Secondary | ICD-10-CM | POA: Diagnosis not present

## 2020-04-07 DIAGNOSIS — M62830 Muscle spasm of back: Secondary | ICD-10-CM | POA: Diagnosis not present

## 2020-04-07 DIAGNOSIS — M9901 Segmental and somatic dysfunction of cervical region: Secondary | ICD-10-CM | POA: Diagnosis not present

## 2020-04-09 DIAGNOSIS — M9901 Segmental and somatic dysfunction of cervical region: Secondary | ICD-10-CM | POA: Diagnosis not present

## 2020-04-09 DIAGNOSIS — M9902 Segmental and somatic dysfunction of thoracic region: Secondary | ICD-10-CM | POA: Diagnosis not present

## 2020-04-09 DIAGNOSIS — M62830 Muscle spasm of back: Secondary | ICD-10-CM | POA: Diagnosis not present

## 2020-04-09 DIAGNOSIS — M546 Pain in thoracic spine: Secondary | ICD-10-CM | POA: Diagnosis not present

## 2020-04-14 DIAGNOSIS — M9902 Segmental and somatic dysfunction of thoracic region: Secondary | ICD-10-CM | POA: Diagnosis not present

## 2020-04-14 DIAGNOSIS — M9901 Segmental and somatic dysfunction of cervical region: Secondary | ICD-10-CM | POA: Diagnosis not present

## 2020-04-14 DIAGNOSIS — M62830 Muscle spasm of back: Secondary | ICD-10-CM | POA: Diagnosis not present

## 2020-04-14 DIAGNOSIS — M546 Pain in thoracic spine: Secondary | ICD-10-CM | POA: Diagnosis not present

## 2020-04-15 ENCOUNTER — Ambulatory Visit
Admission: RE | Admit: 2020-04-15 | Discharge: 2020-04-15 | Disposition: A | Discharge status: Home / Self Care | Payer: BC Managed Care – PPO | Source: Ambulatory Visit | Attending: Obstetrics and Gynecology | Admitting: Obstetrics and Gynecology

## 2020-04-15 ENCOUNTER — Other Ambulatory Visit: Payer: Self-pay

## 2020-04-15 DIAGNOSIS — Z1231 Encounter for screening mammogram for malignant neoplasm of breast: Secondary | ICD-10-CM | POA: Diagnosis not present

## 2020-04-15 DIAGNOSIS — Z0000 Encounter for general adult medical examination without abnormal findings: Secondary | ICD-10-CM

## 2020-04-16 DIAGNOSIS — M9902 Segmental and somatic dysfunction of thoracic region: Secondary | ICD-10-CM | POA: Diagnosis not present

## 2020-04-16 DIAGNOSIS — M62830 Muscle spasm of back: Secondary | ICD-10-CM | POA: Diagnosis not present

## 2020-04-16 DIAGNOSIS — M546 Pain in thoracic spine: Secondary | ICD-10-CM | POA: Diagnosis not present

## 2020-04-16 DIAGNOSIS — M9901 Segmental and somatic dysfunction of cervical region: Secondary | ICD-10-CM | POA: Diagnosis not present

## 2020-04-21 DIAGNOSIS — M62830 Muscle spasm of back: Secondary | ICD-10-CM | POA: Diagnosis not present

## 2020-04-21 DIAGNOSIS — M9901 Segmental and somatic dysfunction of cervical region: Secondary | ICD-10-CM | POA: Diagnosis not present

## 2020-04-21 DIAGNOSIS — M546 Pain in thoracic spine: Secondary | ICD-10-CM | POA: Diagnosis not present

## 2020-04-21 DIAGNOSIS — M9902 Segmental and somatic dysfunction of thoracic region: Secondary | ICD-10-CM | POA: Diagnosis not present

## 2020-04-23 DIAGNOSIS — M9902 Segmental and somatic dysfunction of thoracic region: Secondary | ICD-10-CM | POA: Diagnosis not present

## 2020-04-23 DIAGNOSIS — M9901 Segmental and somatic dysfunction of cervical region: Secondary | ICD-10-CM | POA: Diagnosis not present

## 2020-04-23 DIAGNOSIS — M546 Pain in thoracic spine: Secondary | ICD-10-CM | POA: Diagnosis not present

## 2020-04-23 DIAGNOSIS — M62830 Muscle spasm of back: Secondary | ICD-10-CM | POA: Diagnosis not present

## 2020-04-28 DIAGNOSIS — M62830 Muscle spasm of back: Secondary | ICD-10-CM | POA: Diagnosis not present

## 2020-04-28 DIAGNOSIS — M9901 Segmental and somatic dysfunction of cervical region: Secondary | ICD-10-CM | POA: Diagnosis not present

## 2020-04-28 DIAGNOSIS — M546 Pain in thoracic spine: Secondary | ICD-10-CM | POA: Diagnosis not present

## 2020-04-28 DIAGNOSIS — M9902 Segmental and somatic dysfunction of thoracic region: Secondary | ICD-10-CM | POA: Diagnosis not present

## 2020-04-30 DIAGNOSIS — M546 Pain in thoracic spine: Secondary | ICD-10-CM | POA: Diagnosis not present

## 2020-04-30 DIAGNOSIS — M62830 Muscle spasm of back: Secondary | ICD-10-CM | POA: Diagnosis not present

## 2020-04-30 DIAGNOSIS — M9901 Segmental and somatic dysfunction of cervical region: Secondary | ICD-10-CM | POA: Diagnosis not present

## 2020-04-30 DIAGNOSIS — M9902 Segmental and somatic dysfunction of thoracic region: Secondary | ICD-10-CM | POA: Diagnosis not present

## 2020-05-05 DIAGNOSIS — M9902 Segmental and somatic dysfunction of thoracic region: Secondary | ICD-10-CM | POA: Diagnosis not present

## 2020-05-05 DIAGNOSIS — M62830 Muscle spasm of back: Secondary | ICD-10-CM | POA: Diagnosis not present

## 2020-05-05 DIAGNOSIS — M9901 Segmental and somatic dysfunction of cervical region: Secondary | ICD-10-CM | POA: Diagnosis not present

## 2020-05-05 DIAGNOSIS — M546 Pain in thoracic spine: Secondary | ICD-10-CM | POA: Diagnosis not present

## 2020-06-23 DIAGNOSIS — M9902 Segmental and somatic dysfunction of thoracic region: Secondary | ICD-10-CM | POA: Diagnosis not present

## 2020-06-23 DIAGNOSIS — M9901 Segmental and somatic dysfunction of cervical region: Secondary | ICD-10-CM | POA: Diagnosis not present

## 2020-06-23 DIAGNOSIS — M546 Pain in thoracic spine: Secondary | ICD-10-CM | POA: Diagnosis not present

## 2020-06-23 DIAGNOSIS — M62830 Muscle spasm of back: Secondary | ICD-10-CM | POA: Diagnosis not present

## 2020-06-25 DIAGNOSIS — M9901 Segmental and somatic dysfunction of cervical region: Secondary | ICD-10-CM | POA: Diagnosis not present

## 2020-06-25 DIAGNOSIS — M62830 Muscle spasm of back: Secondary | ICD-10-CM | POA: Diagnosis not present

## 2020-06-25 DIAGNOSIS — M9902 Segmental and somatic dysfunction of thoracic region: Secondary | ICD-10-CM | POA: Diagnosis not present

## 2020-06-25 DIAGNOSIS — M546 Pain in thoracic spine: Secondary | ICD-10-CM | POA: Diagnosis not present

## 2020-07-06 DIAGNOSIS — E785 Hyperlipidemia, unspecified: Secondary | ICD-10-CM | POA: Diagnosis not present

## 2020-07-06 DIAGNOSIS — Z0000 Encounter for general adult medical examination without abnormal findings: Secondary | ICD-10-CM | POA: Diagnosis not present

## 2020-07-17 DIAGNOSIS — Z0000 Encounter for general adult medical examination without abnormal findings: Secondary | ICD-10-CM | POA: Diagnosis not present

## 2020-07-17 DIAGNOSIS — R82998 Other abnormal findings in urine: Secondary | ICD-10-CM | POA: Diagnosis not present

## 2020-07-17 DIAGNOSIS — E785 Hyperlipidemia, unspecified: Secondary | ICD-10-CM | POA: Diagnosis not present

## 2020-08-10 DIAGNOSIS — H35033 Hypertensive retinopathy, bilateral: Secondary | ICD-10-CM | POA: Diagnosis not present

## 2020-08-10 DIAGNOSIS — H26491 Other secondary cataract, right eye: Secondary | ICD-10-CM | POA: Diagnosis not present

## 2020-08-10 DIAGNOSIS — H353132 Nonexudative age-related macular degeneration, bilateral, intermediate dry stage: Secondary | ICD-10-CM | POA: Diagnosis not present

## 2020-08-10 DIAGNOSIS — H43812 Vitreous degeneration, left eye: Secondary | ICD-10-CM | POA: Diagnosis not present

## 2020-08-13 DIAGNOSIS — M19022 Primary osteoarthritis, left elbow: Secondary | ICD-10-CM | POA: Diagnosis not present

## 2020-08-13 DIAGNOSIS — M7702 Medial epicondylitis, left elbow: Secondary | ICD-10-CM | POA: Diagnosis not present

## 2020-08-17 DIAGNOSIS — Z01419 Encounter for gynecological examination (general) (routine) without abnormal findings: Secondary | ICD-10-CM | POA: Diagnosis not present

## 2021-01-04 DIAGNOSIS — Z85828 Personal history of other malignant neoplasm of skin: Secondary | ICD-10-CM | POA: Diagnosis not present

## 2021-01-04 DIAGNOSIS — L821 Other seborrheic keratosis: Secondary | ICD-10-CM | POA: Diagnosis not present

## 2021-01-04 DIAGNOSIS — L718 Other rosacea: Secondary | ICD-10-CM | POA: Diagnosis not present

## 2021-01-04 DIAGNOSIS — D692 Other nonthrombocytopenic purpura: Secondary | ICD-10-CM | POA: Diagnosis not present

## 2021-01-04 DIAGNOSIS — L570 Actinic keratosis: Secondary | ICD-10-CM | POA: Diagnosis not present

## 2021-02-09 ENCOUNTER — Encounter (INDEPENDENT_AMBULATORY_CARE_PROVIDER_SITE_OTHER): Payer: BC Managed Care – PPO | Admitting: Ophthalmology

## 2021-04-08 DIAGNOSIS — H2513 Age-related nuclear cataract, bilateral: Secondary | ICD-10-CM | POA: Diagnosis not present

## 2021-04-30 ENCOUNTER — Other Ambulatory Visit: Payer: Self-pay | Admitting: Obstetrics and Gynecology

## 2021-04-30 DIAGNOSIS — Z1231 Encounter for screening mammogram for malignant neoplasm of breast: Secondary | ICD-10-CM

## 2021-05-17 ENCOUNTER — Ambulatory Visit: Payer: Self-pay

## 2021-06-01 ENCOUNTER — Other Ambulatory Visit: Payer: Self-pay

## 2021-06-01 ENCOUNTER — Encounter (INDEPENDENT_AMBULATORY_CARE_PROVIDER_SITE_OTHER): Payer: Medicare Other | Admitting: Ophthalmology

## 2021-06-01 DIAGNOSIS — H26492 Other secondary cataract, left eye: Secondary | ICD-10-CM | POA: Diagnosis not present

## 2021-06-01 DIAGNOSIS — H353132 Nonexudative age-related macular degeneration, bilateral, intermediate dry stage: Secondary | ICD-10-CM | POA: Diagnosis not present

## 2021-06-01 DIAGNOSIS — H43813 Vitreous degeneration, bilateral: Secondary | ICD-10-CM | POA: Diagnosis not present

## 2021-06-23 ENCOUNTER — Ambulatory Visit
Admission: RE | Admit: 2021-06-23 | Discharge: 2021-06-23 | Disposition: A | Discharge status: Home / Self Care | Payer: Medicare Other | Source: Ambulatory Visit | Attending: Obstetrics and Gynecology | Admitting: Obstetrics and Gynecology

## 2021-06-23 DIAGNOSIS — Z1231 Encounter for screening mammogram for malignant neoplasm of breast: Secondary | ICD-10-CM

## 2022-02-11 ENCOUNTER — Other Ambulatory Visit: Payer: Self-pay | Admitting: Orthopedic Surgery

## 2022-02-11 DIAGNOSIS — M7712 Lateral epicondylitis, left elbow: Secondary | ICD-10-CM

## 2022-02-27 ENCOUNTER — Ambulatory Visit
Admission: RE | Admit: 2022-02-27 | Discharge: 2022-02-27 | Disposition: A | Discharge status: Home / Self Care | Payer: Medicare Other | Source: Ambulatory Visit | Attending: Orthopedic Surgery | Admitting: Orthopedic Surgery

## 2022-02-27 DIAGNOSIS — M7712 Lateral epicondylitis, left elbow: Secondary | ICD-10-CM

## 2022-03-16 IMAGING — MG DIGITAL SCREENING BREAST BILAT IMPLANT W/ TOMO W/ CAD
9 of 12 series · 9 of 28 positions shown · non-contrast
Comparison: Previous exam(s).

CLINICAL DATA: Screening.

EXAM:
DIGITAL SCREENING BILATERAL MAMMOGRAM WITH IMPLANTS, CAD AND
TOMOSYNTHESIS
TECHNIQUE: Bilateral screening digital craniocaudal and mediolateral oblique
mammograms were obtained. Bilateral screening digital breast
tomosynthesis was performed. The images were evaluated with
computer-aided detection. Standard and/or implant displaced views
were performed.

[L CC]
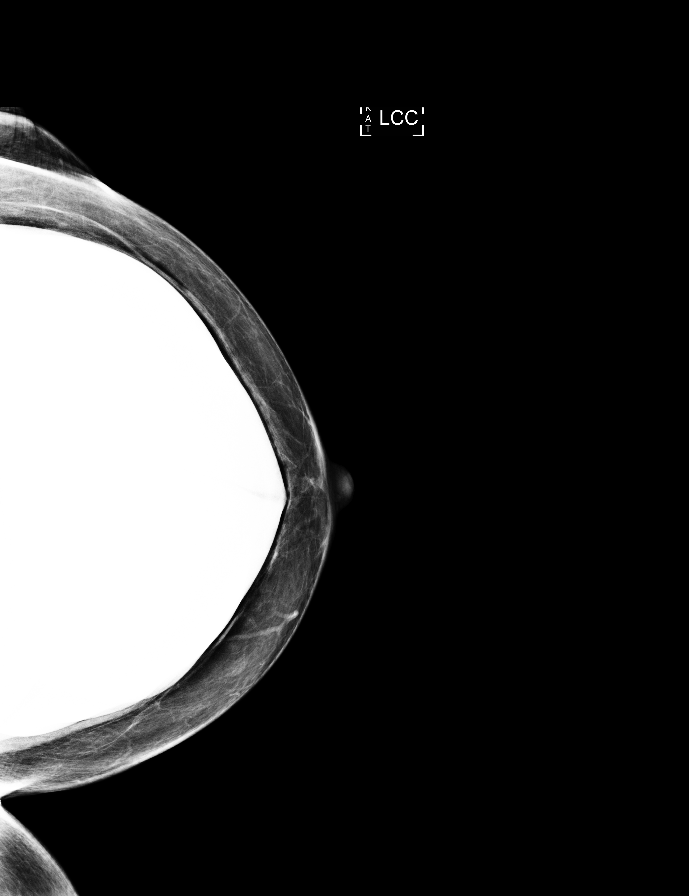

[L MLO]
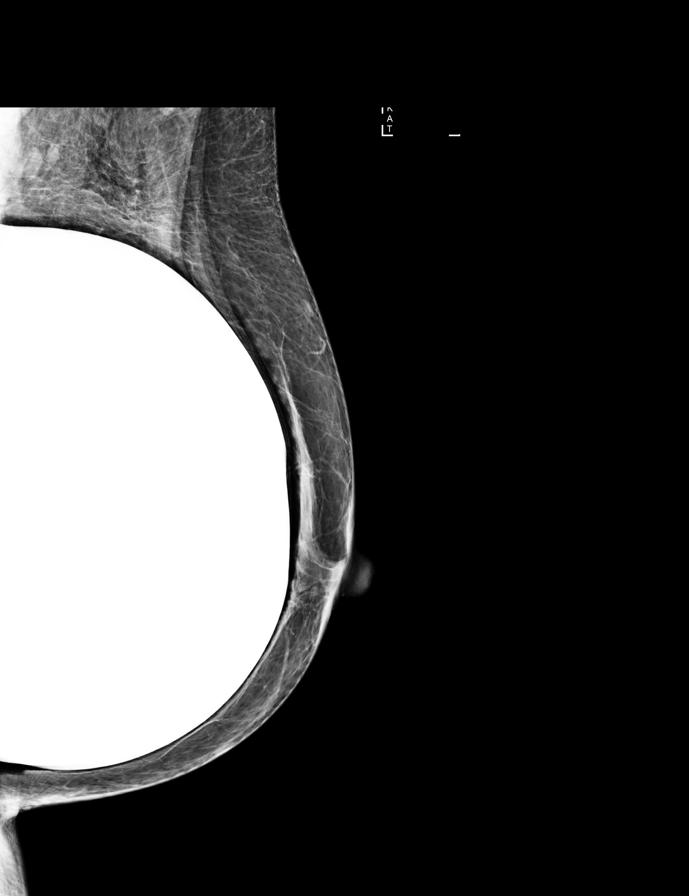

[R MLO]
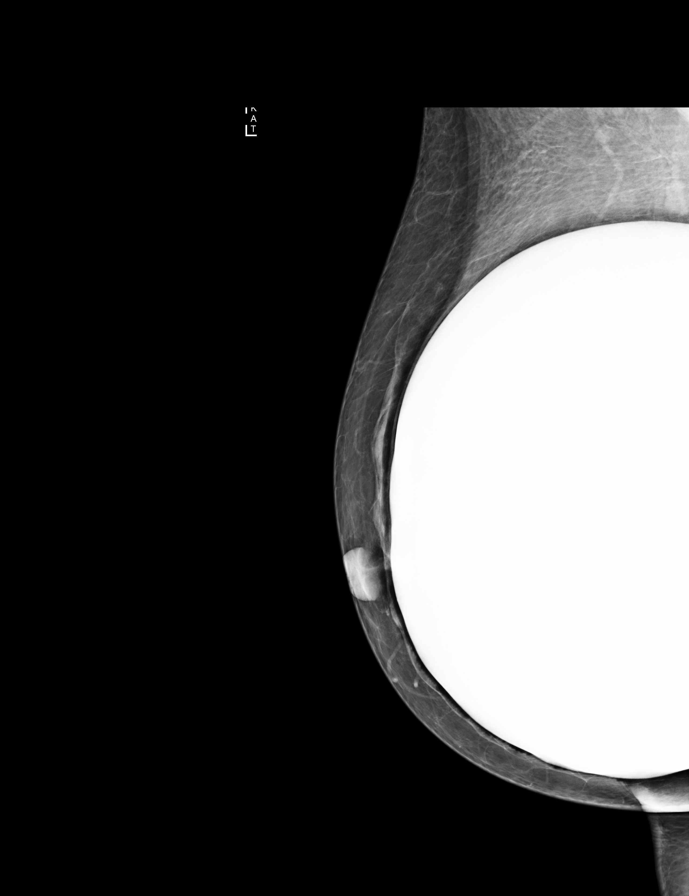

[R CC]
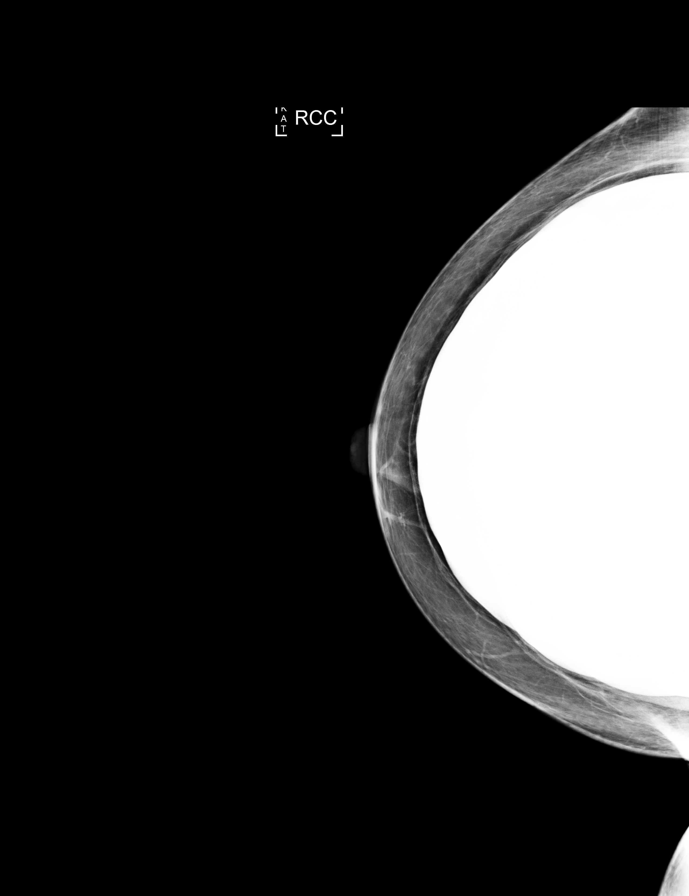

[R CC synth-2D]
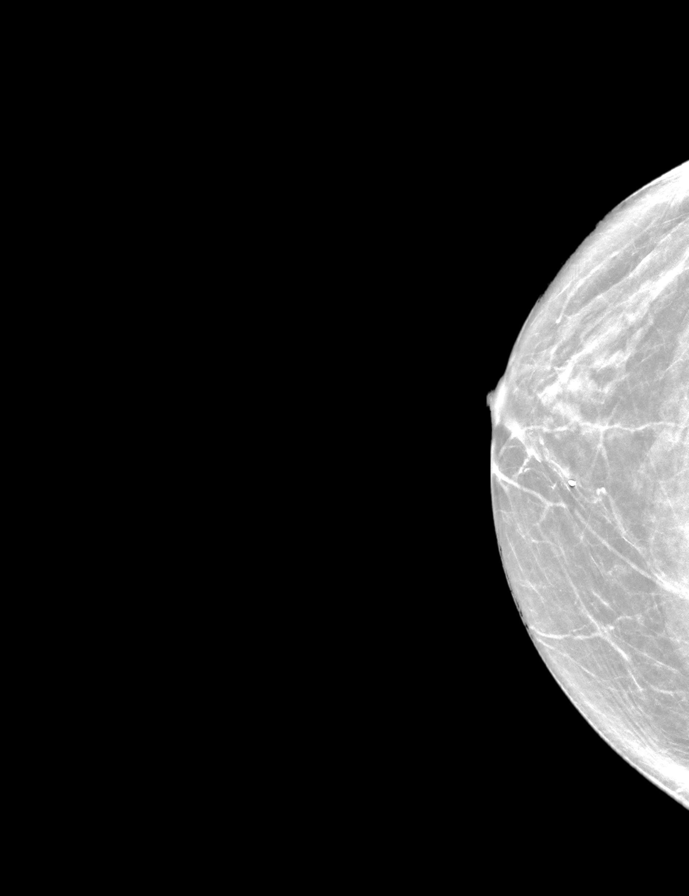

[R MLO synth-2D]
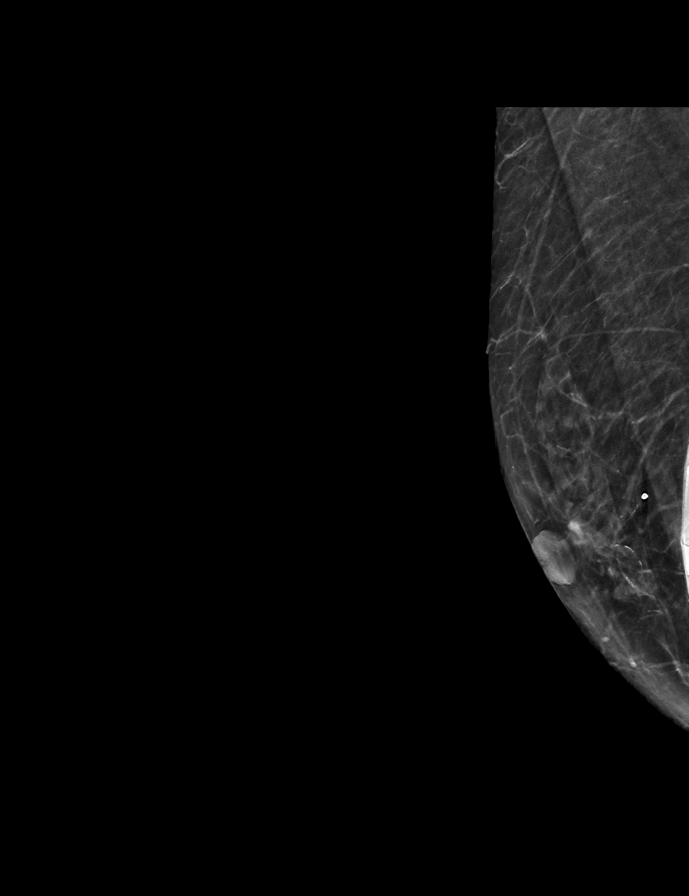

[L CC synth-2D]
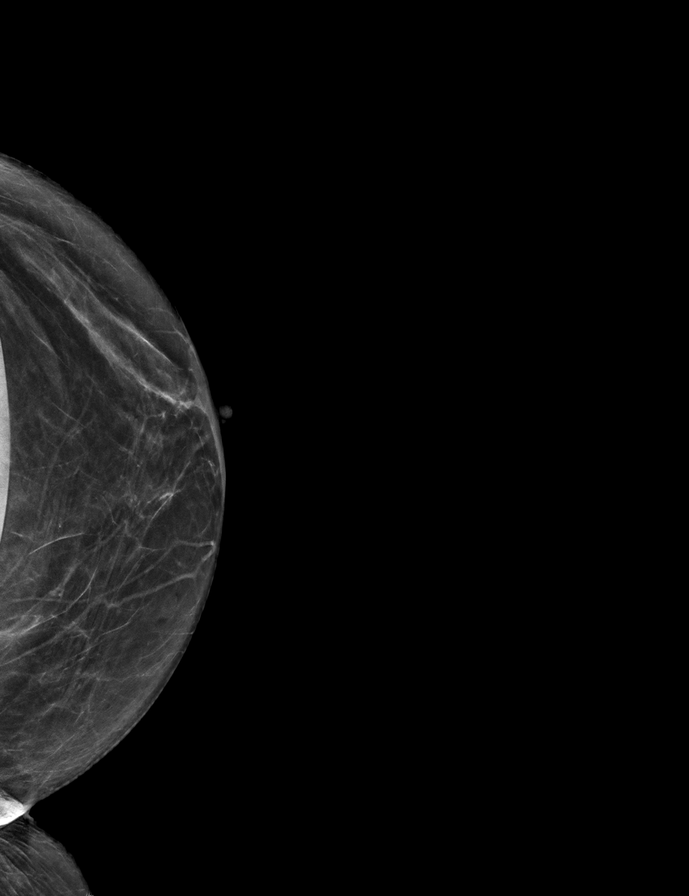

[L MLO synth-2D]
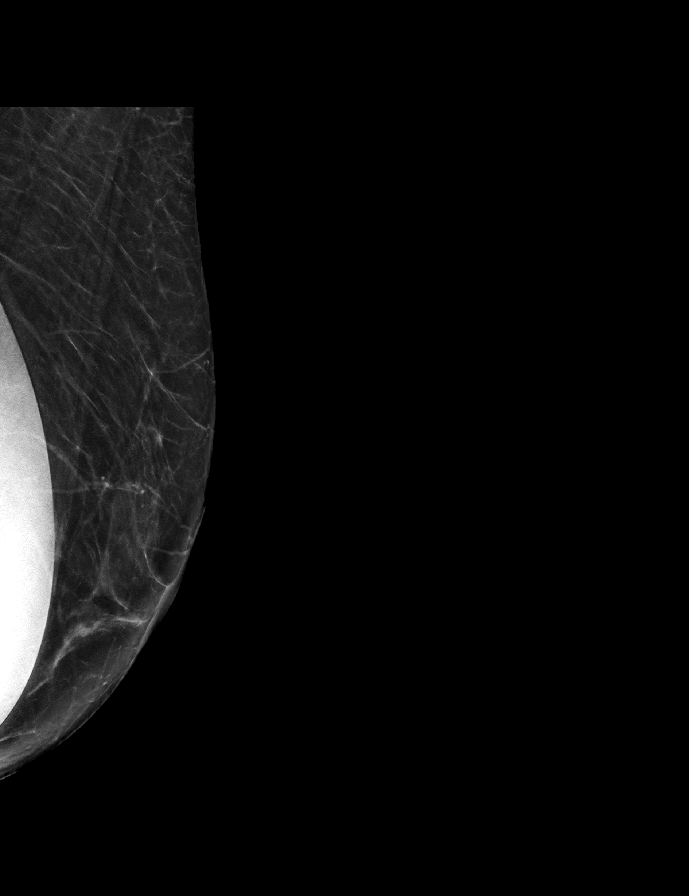

[R MLOID BREAST TOMOSYNTHESIS IMAGE tomo · tomo slice 25/49.0]
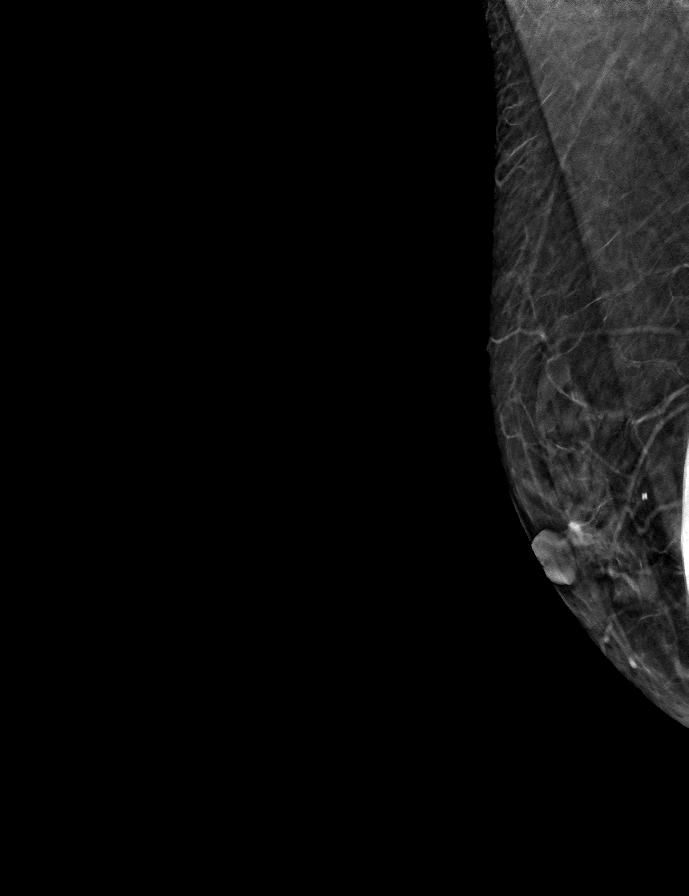

[9 of 28 positions shown; findings below may reference images not displayed]

ACR Breast Density Category b: There are scattered areas of
fibroglandular density.
FINDINGS: The patient has retropectoral silicone implants. There are no
findings suspicious for malignancy.
IMPRESSION: No mammographic evidence of malignancy. A result letter of this
screening mammogram will be mailed directly to the patient.

RECOMMENDATION:
Screening mammogram in one year. (Code:AF-I-1PX)

BI-RADS CATEGORY  1:  Negative.

## 2022-03-22 ENCOUNTER — Other Ambulatory Visit: Payer: Self-pay | Admitting: Orthopaedic Surgery

## 2022-03-22 DIAGNOSIS — M25722 Osteophyte, left elbow: Secondary | ICD-10-CM

## 2022-04-26 ENCOUNTER — Ambulatory Visit
Admission: RE | Admit: 2022-04-26 | Discharge: 2022-04-26 | Disposition: A | Discharge status: Home / Self Care | Payer: Medicare Other | Source: Ambulatory Visit | Attending: Orthopaedic Surgery | Admitting: Orthopaedic Surgery

## 2022-04-26 DIAGNOSIS — M25722 Osteophyte, left elbow: Secondary | ICD-10-CM

## 2022-06-01 ENCOUNTER — Encounter (INDEPENDENT_AMBULATORY_CARE_PROVIDER_SITE_OTHER): Payer: Medicare Other | Admitting: Ophthalmology

## 2022-06-14 ENCOUNTER — Encounter (INDEPENDENT_AMBULATORY_CARE_PROVIDER_SITE_OTHER): Payer: Medicare Other | Admitting: Ophthalmology

## 2022-06-14 DIAGNOSIS — H353132 Nonexudative age-related macular degeneration, bilateral, intermediate dry stage: Secondary | ICD-10-CM

## 2022-06-14 DIAGNOSIS — H43813 Vitreous degeneration, bilateral: Secondary | ICD-10-CM

## 2022-07-30 ENCOUNTER — Emergency Department (HOSPITAL_BASED_OUTPATIENT_CLINIC_OR_DEPARTMENT_OTHER)
Admission: EM | Admit: 2022-07-30 | Discharge: 2022-07-30 | Disposition: A | Discharge status: Home / Self Care | Payer: Medicare Other | Attending: Emergency Medicine | Admitting: Emergency Medicine

## 2022-07-30 ENCOUNTER — Other Ambulatory Visit: Payer: Self-pay

## 2022-07-30 DIAGNOSIS — I10 Essential (primary) hypertension: Secondary | ICD-10-CM | POA: Diagnosis not present

## 2022-07-30 DIAGNOSIS — M7631 Iliotibial band syndrome, right leg: Secondary | ICD-10-CM | POA: Diagnosis not present

## 2022-07-30 DIAGNOSIS — M545 Low back pain, unspecified: Secondary | ICD-10-CM | POA: Diagnosis not present

## 2022-07-30 DIAGNOSIS — S76011A Strain of muscle, fascia and tendon of right hip, initial encounter: Secondary | ICD-10-CM | POA: Insufficient documentation

## 2022-07-30 DIAGNOSIS — S79911A Unspecified injury of right hip, initial encounter: Secondary | ICD-10-CM | POA: Diagnosis present

## 2022-07-30 DIAGNOSIS — E119 Type 2 diabetes mellitus without complications: Secondary | ICD-10-CM | POA: Insufficient documentation

## 2022-07-30 DIAGNOSIS — X58XXXA Exposure to other specified factors, initial encounter: Secondary | ICD-10-CM | POA: Insufficient documentation

## 2022-07-30 MED ORDER — PREDNISONE 10 MG (21) PO TBPK: ORAL_TABLET | Freq: Every day | ORAL | 0 refills | Status: DC

## 2022-07-30 MED ORDER — METHOCARBAMOL 500 MG PO TABS: 500.0000 mg | ORAL_TABLET | Freq: Two times a day (BID) | ORAL | 0 refills | Status: DC

## 2022-07-30 NOTE — ED Provider Notes (Signed)
[facility] AT [facility] Provider Note   CSN: [id number] Arrival date & time: 07/30/22  1645     History  Chief Complaint  Patient presents with   Hip Pain    Right     Brittany Perry is a 67 y.o. female with PMH significant for chronic back pain, without a history of hypertension, diabetes who presents with concern for acute on chronic worsening of right hip pain/low back pain.  Patient reports that she is being seen and evaluated by neurosurgeon, she had injections/ablation and did not seem to have any relief of the symptoms that she is currently having like she does for her other back pain.  They told her that they thought it was more muscular in nature and discharged her to rehab alone.  She has not seen her regular orthopedic physician about this.  She has been trying meloxicam, rehab exercises, she reports that she sometimes is not having any pain at all but then it occasionally will cause her severe pain radiating down her right leg, causing her to nearly collapse.  She denies any numbness, tingling, saddle anesthesia, she has no history of IV drug use, chronic corticosteroid use, cancer.  She denies history of fever, chills, urinary or fecal incontinence.   Hip Pain       Home Medications Prior to Admission medications   Medication Sig Start Date End Date Taking? Authorizing Provider  methocarbamol (ROBAXIN) 500 MG tablet Take 1 tablet (500 mg total) by mouth 2 (two) times daily. 07/30/22  Yes Sevilla Murtagh H, PA-C  predniSONE (STERAPRED UNI-PAK 21 TAB) 10 MG (21) TBPK tablet Take by mouth daily. Take 6 tabs by mouth daily  for 2 days, then 5 tabs for 2 days, then 4 tabs for 2 days, then 3 tabs for 2 days, 2 tabs for 2 days, then 1 tab by mouth daily for 2 days 07/30/22  Yes Eleyna Brugh H, PA-C  atorvastatin (LIPITOR) 40 MG tablet TK 1 T PO  QD 03/20/15   [provider]  meloxicam (MOBIC) 15 MG tablet Take 1 tablet (15 mg  total) by mouth daily. 03/26/15   Lenn Sink, DPM  venlafaxine XR (EFFEXOR-XR) 75 MG 24 hr capsule TK ONE C PO  QD. 03/20/15   [provider]      Allergies    Codeine and Penicillins    Review of Systems   Review of Systems  All other systems reviewed and are negative.   Physical Exam Updated Vital Signs BP (!) 146/87 (BP Location: Right Arm)   Pulse 89   Temp 98.1 F (36.7 C) (Oral)   Resp 20   Ht 5\' 2"  (1.575 m)   Wt 54.4 kg   SpO2 94%   BMI 21.95 kg/m  Physical Exam Vitals and nursing note reviewed.  Constitutional:      General: She is not in acute distress.    Appearance: Normal appearance.  HENT:     Head: Normocephalic and atraumatic.  Eyes:     General:        Right eye: No discharge.        Left eye: No discharge.  Cardiovascular:     Rate and Rhythm: Normal rate and regular rhythm.  Pulmonary:     Effort: Pulmonary effort is normal. No respiratory distress.  Musculoskeletal:        General: No deformity.     Comments: some tenderness in the right lumbar paraspinous muscles, especially in  the gluteus maximus, and medius region, focally over the ischial tuberosity, she has pain along the IT band.  She is intact strength 5/5 of bilateral lower extremities.  She has normal coordination of bilateral lower extremities.  Normal sensation throughout.  Negative straight leg raise on right  Skin:    General: Skin is warm and dry.  Neurological:     Mental Status: She is alert and oriented to person, place, and time.  Psychiatric:        Mood and Affect: Mood normal.        Behavior: Behavior normal.     ED Results / Procedures / Treatments   Labs (all labs ordered are listed, but only abnormal results are displayed) Labs Reviewed - No data to display  EKG None  Radiology No results found.  Procedures Procedures    Medications Ordered in ED Medications - No data to display  ED Course/ Medical Decision Making/ A&P                              Medical Decision Making  Patient with back pain.  My emergent differential diagnosis includes slipped disc, compression fracture, spondylolisthesis, less clinical concern for epidural abscess or osteomyelitis based on patient history.  I am suspicious of gluteus medius or IT band dysfunction based on her symptoms.  She does not have positive straight leg raise on right, low clinical suspicion for sciatica related pain.  No neurological deficits. Patient is ambulatory. No warning symptoms of back pain including: fecal incontinence, urinary retention or overflow incontinence, night sweats, waking from sleep with back pain, unexplained fevers or weight loss, h/o cancer, IVDU, recent trauma. No concern for cauda equina, epidural abscess, or other serious cause of back pain.  Given this work-up, evaluation, physical exam I do not believe that radiographic imaging is indicated at this time.  Conservative measures such as rest, ice/heat, ibuprofen, Tylenol, and  prescription for Robaxin indicated with orthopedic follow-up if no improvement with conservative management.  Given the duration of her symptoms I do think that a prednisone taper is reasonable to help give her some relief of symptoms until she is able to secure orthopedic follow-up.  Extensive return precautions given, patient discharged in stable condition at this time.  Final Clinical Impression(s) / ED Diagnoses Final diagnoses:  Acute right-sided low back pain without sciatica  Strain of gluteus medius of right lower extremity, initial encounter  It band syndrome, right    Rx / DC Orders ED Discharge Orders          Ordered    predniSONE (STERAPRED UNI-PAK 21 TAB) 10 MG (21) TBPK tablet  Daily        07/30/22 1755    methocarbamol (ROBAXIN) 500 MG tablet  2 times daily        07/30/22 1755              Sephira Zellman, [place] H, PA-C 07/30/22 1801    Arby Barrette, MD 07/30/22 1827

## 2022-07-30 NOTE — Discharge Instructions (Addendum)
As we discussed I cannot put a fine point on the diagnosis, however I do have some suspicion of either gluteus medius dysfunction or IT band dysfunction. I placed some information on your discharge instructions if you would like to read up a little bit more on these conditions to see if they seem more consistent with the pain that you are having, ultimately however I would recommend that you follow-up with your orthopedic doctor, and discuss additional workup, treatment, management if your symptoms do not significantly improve with the steroids.  You can use the muscle relaxant I am prescribing in addition to the above to help with any breakthrough pain.  You can take it up to twice daily.  It is safe to take at night, but I would be cautious taking it during the day as it can cause some drowsiness.  Make sure that you are feeling awake and alert before you get behind the wheel of a car or operate a motor vehicle.  It is not a narcotic pain medication so you are able to take it if it is not making you drowsy and still pilot a vehicle or machinery safely.

## 2022-07-30 NOTE — ED Triage Notes (Signed)
Patient arrives with complaints of worsening right hip pain radiating down her leg. Patient reports that this has been an ongoing issue for 2 months and she is being seen by neurosurgery.   Patient states that she is in Physical therapy, but not getting any relief.   Rates pain a 3/10.

## 2022-08-02 ENCOUNTER — Telehealth: Payer: Self-pay | Admitting: *Deleted

## 2022-08-02 NOTE — Telephone Encounter (Signed)
        Patient  visited [facility] on 07/30/2022  for treatment   Telephone encounter attempt :  1st  A HIPAA compliant voice message was left requesting a return call.  Instructed patient to call back at [phone].  Yehuda Mao Greenauer -[facility] , Population Health [phone] [address] [place] , [place] [address] Email : [email or web address]

## 2022-08-03 ENCOUNTER — Telehealth: Payer: Self-pay | Admitting: *Deleted

## 2022-08-03 NOTE — Telephone Encounter (Signed)
        [facility]  [facility]ited [facility] on 07/30/2022  for treatment   Telephone encounter attempt :  2nd  A HIPAA compliant voice message was left requesting a return call.  Instructed patient to call back at [phone]. Yehuda Mao Greenauer -[facility] , Population Health [phone] [address] [place] , [place] [address] Email : [email or web address]

## 2022-10-18 ENCOUNTER — Other Ambulatory Visit: Payer: Self-pay | Admitting: Obstetrics and Gynecology

## 2022-10-18 DIAGNOSIS — Z1231 Encounter for screening mammogram for malignant neoplasm of breast: Secondary | ICD-10-CM

## 2022-11-16 ENCOUNTER — Ambulatory Visit
Admission: RE | Admit: 2022-11-16 | Discharge: 2022-11-16 | Disposition: A | Discharge status: Home / Self Care | Payer: Medicare Other | Source: Ambulatory Visit | Attending: Obstetrics and Gynecology | Admitting: Obstetrics and Gynecology

## 2022-11-16 DIAGNOSIS — Z1231 Encounter for screening mammogram for malignant neoplasm of breast: Secondary | ICD-10-CM

## 2022-11-20 ENCOUNTER — Emergency Department (HOSPITAL_BASED_OUTPATIENT_CLINIC_OR_DEPARTMENT_OTHER): Payer: Medicare Other

## 2022-11-20 ENCOUNTER — Emergency Department (HOSPITAL_BASED_OUTPATIENT_CLINIC_OR_DEPARTMENT_OTHER)
Admission: EM | Admit: 2022-11-20 | Discharge: 2022-11-20 | Disposition: A | Discharge status: Home / Self Care | Payer: Medicare Other | Attending: Emergency Medicine | Admitting: Emergency Medicine

## 2022-11-20 ENCOUNTER — Emergency Department (HOSPITAL_COMMUNITY): Payer: Medicare Other

## 2022-11-20 ENCOUNTER — Other Ambulatory Visit: Payer: Self-pay

## 2022-11-20 ENCOUNTER — Encounter (HOSPITAL_BASED_OUTPATIENT_CLINIC_OR_DEPARTMENT_OTHER): Payer: Self-pay | Admitting: Emergency Medicine

## 2022-11-20 DIAGNOSIS — R519 Headache, unspecified: Secondary | ICD-10-CM | POA: Diagnosis present

## 2022-11-20 DIAGNOSIS — Z79899 Other long term (current) drug therapy: Secondary | ICD-10-CM | POA: Diagnosis not present

## 2022-11-20 DIAGNOSIS — U071 COVID-19: Secondary | ICD-10-CM | POA: Insufficient documentation

## 2022-11-20 DIAGNOSIS — R200 Anesthesia of skin: Secondary | ICD-10-CM | POA: Diagnosis not present

## 2022-11-20 DIAGNOSIS — R479 Unspecified speech disturbances: Secondary | ICD-10-CM | POA: Diagnosis not present

## 2022-11-20 DIAGNOSIS — R202 Paresthesia of skin: Secondary | ICD-10-CM | POA: Diagnosis not present

## 2022-11-20 DIAGNOSIS — E7800 Pure hypercholesterolemia, unspecified: Secondary | ICD-10-CM | POA: Insufficient documentation

## 2022-11-20 DIAGNOSIS — I10 Essential (primary) hypertension: Secondary | ICD-10-CM | POA: Diagnosis not present

## 2022-11-20 DIAGNOSIS — R4701 Aphasia: Secondary | ICD-10-CM | POA: Diagnosis not present

## 2022-11-20 HISTORY — DX: Depression, unspecified: F32.A

## 2022-11-20 LAB — COMPREHENSIVE METABOLIC PANEL
ALT: 23 U/L (ref 0–44)
AST: 24 U/L (ref 15–41)
Albumin: 4.3 g/dL (ref 3.5–5.0)
Alkaline Phosphatase: 108 U/L (ref 38–126)
Anion gap: 8 (ref 5–15)
BUN: 18 mg/dL (ref 8–23)
CO2: 27 mmol/L (ref 22–32)
Calcium: 8.9 mg/dL (ref 8.9–10.3)
Chloride: 103 mmol/L (ref 98–111)
Creatinine, Ser: 0.94 mg/dL (ref 0.44–1.00)
GFR, Estimated: >60 mL/min (ref >60)
Glucose, Bld: 88 mg/dL (ref 70–99)
Potassium: 3.7 mmol/L (ref 3.5–5.1)
Sodium: 138 mmol/L (ref 135–145)
Total Bilirubin: 0.6 mg/dL (ref 0.3–1.2)
Total Protein: 7.1 g/dL (ref 6.5–8.1)

## 2022-11-20 LAB — CBC
HCT: 41.3 % (ref 36.0–46.0)
Hemoglobin: 13.9 g/dL (ref 12.0–15.0)
MCH: 31.4 pg (ref 26.0–34.0)
MCHC: 33.7 g/dL (ref 30.0–36.0)
MCV: 93.4 fL (ref 80.0–100.0)
Platelets: 275 10*3/uL (ref 150–400)
RBC: 4.42 MIL/uL (ref 3.87–5.11)
RDW: 12.8 % (ref 11.5–15.5)
WBC: 8.5 10*3/uL (ref 4.0–10.5)
nRBC: 0 % (ref 0.0–0.2)

## 2022-11-20 LAB — URINALYSIS, ROUTINE W REFLEX MICROSCOPIC
Bilirubin Urine: NEGATIVE
Glucose, UA: NEGATIVE mg/dL
Hgb urine dipstick: NEGATIVE
Ketones, ur: NEGATIVE mg/dL
Nitrite: NEGATIVE
Protein, ur: NEGATIVE mg/dL
Specific Gravity, Urine: 1.008 (ref 1.005–1.030)
Trans Epithel, UA: 1
pH: 5 (ref 5.0–8.0)

## 2022-11-20 LAB — RAPID URINE DRUG SCREEN, HOSP PERFORMED
Amphetamines: NOT DETECTED
Barbiturates: NOT DETECTED
Benzodiazepines: NOT DETECTED
Cocaine: NOT DETECTED
Opiates: NOT DETECTED
Tetrahydrocannabinol: NOT DETECTED

## 2022-11-20 LAB — DIFFERENTIAL
Abs Immature Granulocytes: 0.02 10*3/uL (ref 0.00–0.07)
Basophils Absolute: 0.1 10*3/uL (ref 0.0–0.1)
Basophils Relative: 1 %
Eosinophils Absolute: 0.1 10*3/uL (ref 0.0–0.5)
Eosinophils Relative: 1 %
Immature Granulocytes: 0 %
Lymphocytes Relative: 26 %
Lymphs Abs: 2.2 10*3/uL (ref 0.7–4.0)
Monocytes Absolute: 0.9 10*3/uL (ref 0.1–1.0)
Monocytes Relative: 10 %
Neutro Abs: 5.3 10*3/uL (ref 1.7–7.7)
Neutrophils Relative %: 62 %

## 2022-11-20 LAB — CBG MONITORING, ED: Glucose-Capillary: 78 mg/dL (ref 70–99)

## 2022-11-20 LAB — PROTIME-INR
INR: 0.9 (ref 0.8–1.2)
Prothrombin Time: 12.4 seconds (ref 11.4–15.2)

## 2022-11-20 LAB — SARS CORONAVIRUS 2 BY RT PCR: SARS Coronavirus 2 by RT PCR: POSITIVE — AB

## 2022-11-20 LAB — APTT: aPTT: 25 seconds (ref 24–36)

## 2022-11-20 LAB — ETHANOL: Alcohol, Ethyl (B): <10 mg/dL (ref <10)

## 2022-11-20 MED ORDER — LORAZEPAM 2 MG/ML IJ SOLN: 1.0000 mg | Freq: Once | INTRAMUSCULAR | Status: DC | PRN

## 2022-11-20 MED ORDER — PROCHLORPERAZINE EDISYLATE 10 MG/2ML IJ SOLN
10.0000 mg | Freq: Once | INTRAMUSCULAR | Status: AC
  Administered 2022-11-20: 10 mg via INTRAVENOUS
  Filled 2022-11-20: qty 2

## 2022-11-20 MED ORDER — DIAZEPAM 2 MG PO TABS
2.0000 mg | ORAL_TABLET | Freq: Once | ORAL | Status: AC
  Administered 2022-11-20: 2 mg via ORAL
  Filled 2022-11-20: qty 1

## 2022-11-20 MED ORDER — ASPIRIN 81 MG PO TBEC
81.0000 mg | DELAYED_RELEASE_TABLET | Freq: Every day | ORAL | Status: DC
  Administered 2022-11-20: 81 mg via ORAL
  Filled 2022-11-20: qty 1

## 2022-11-20 MED ORDER — SODIUM CHLORIDE 0.9 % IV BOLUS
1000.0000 mL | Freq: Once | INTRAVENOUS | Status: AC
  Administered 2022-11-20: 1000 mL via INTRAVENOUS

## 2022-11-20 NOTE — ED Notes (Signed)
Patient transported to MRI 

## 2022-11-20 NOTE — ED Triage Notes (Signed)
[place] was sitting in [place], had a severe headache,sudden, did nt get better after tylenol.pt was having difficulty talking , states now her hands feel wiggly. This happened 1000 today.pt unable to answer questions fully in the triage room.

## 2022-11-20 NOTE — ED Notes (Signed)
Attempted to call [facility] charge to give report on this Pt had no answer. Pt left with [facility] and is on the way to [facility] ED for her MRI.

## 2022-11-20 NOTE — ED Provider Notes (Signed)
Patient transferred for MRI. Covid came back positive.  Headache symptoms have resolved.  No further speech abnormality.  I discussed with Dr. Otelia Limes, given the negative MRI results, and the neuro symptoms have resolved, this is likely a complicated migraine she can follow-up with outpatient neuro.  No indication for TIA workup.  Of note, her COVID test is positive but she has no symptoms.  She wants to defer any type of management at this time.   Pricilla Loveless, MD 11/20/22 [phone]

## 2022-11-20 NOTE — ED Notes (Signed)
Pt arrives via [facility] as a transfer from [facility]. She is here to have MRI.

## 2022-11-20 NOTE — Discharge Instructions (Signed)
Your Covid test is positive. However, it doesn't appear like you have any symptoms. Watch for symptoms, such as cough, sore throat, fever, etc. Otherwise, follow up with your primary care doctor.  For your headache, follow up with neurology.   If you develop continued, recurrent, or worsening headache, fever, neck stiffness, vomiting, blurry or double vision, weakness or numbness in your arms or legs, trouble speaking, or any other new/concerning symptoms then return to the ER for evaluation.

## 2022-11-20 NOTE — Progress Notes (Addendum)
Code stroke activated per elert @ 1109.  Pt just returned from CT prior to activation.  Paged DR Georg Ruddle @ [phone], and  on camera to assess patient @ 1118.  LKWT 1000, reports that she developed difficulty with speech. Dr Otelia Limes notified of NCCT head results. No TNK, low NIH. TSRN off camera @ 1146.   MRS 0  Social worker

## 2022-11-20 NOTE — ED Notes (Signed)
[place] with  cl called for transport 

## 2022-11-20 NOTE — ED Provider Notes (Signed)
[place] EMERGENCY DEPARTMENT AT [facility] Provider Note   CSN: [id number] Arrival date & time: 11/20/22  1038     History  Chief Complaint  Patient presents with   Code Stroke    Brittany Perry is a 67 y.o. female.  HPI Patient was at church at 10 AM when she had a sudden onset of severe headache.  Tried Tylenol with no relief.  At the time when headache was severe she was having difficulty talking and forming words.  She also received tingling in her fingertips.  She rates the headache 8 out of 10 and had throbbing frontal headache.  No nausea or vomiting.  No syncope.  Patient denies history of similar headaches.  She has very distant history of ophthalmic migraine.  Has been recently well.  No fevers no chills no general illness.    Home Medications Prior to Admission medications   Medication Sig Start Date End Date Taking? Authorizing Provider  atorvastatin (LIPITOR) 40 MG tablet TK 1 T PO  QD 03/20/15   [provider]  meloxicam (MOBIC) 15 MG tablet Take 1 tablet (15 mg total) by mouth daily. 03/26/15   Lenn Sink, DPM  methocarbamol (ROBAXIN) 500 MG tablet Take 1 tablet (500 mg total) by mouth 2 (two) times daily. 07/30/22   Prosperi, Christian H, PA-C  predniSONE (STERAPRED UNI-PAK 21 TAB) 10 MG (21) TBPK tablet Take by mouth daily. Take 6 tabs by mouth daily  for 2 days, then 5 tabs for 2 days, then 4 tabs for 2 days, then 3 tabs for 2 days, 2 tabs for 2 days, then 1 tab by mouth daily for 2 days 07/30/22   Prosperi, Christian H, PA-C  venlafaxine XR (EFFEXOR-XR) 75 MG 24 hr capsule TK ONE C PO  QD. 03/20/15   [provider]      Allergies    Codeine and Penicillins    Review of Systems   Review of Systems  Physical Exam Updated Vital Signs BP (!) 163/86 (BP Location: Right Arm)   Pulse 71   Temp 98 F (36.7 C) (Oral)   Resp 13   SpO2 100%  Physical Exam Constitutional:      Appearance: Normal appearance.  HENT:     Head:  Normocephalic and atraumatic.     Nose: Nose normal.     Mouth/Throat:     Mouth: Mucous membranes are moist.     Pharynx: Oropharynx is clear.  Eyes:     Extraocular Movements: Extraocular movements intact.     Conjunctiva/sclera: Conjunctivae normal.     Pupils: Pupils are equal, round, and reactive to light.  Cardiovascular:     Rate and Rhythm: Normal rate and regular rhythm.  Pulmonary:     Effort: Pulmonary effort is normal.     Breath sounds: Normal breath sounds.  Abdominal:     General: There is no distension.     Palpations: Abdomen is soft.     Tenderness: There is no abdominal tenderness. There is no guarding.  Musculoskeletal:        General: No swelling or tenderness. Normal range of motion.     Cervical back: Neck supple.     Right lower leg: No edema.     Left lower leg: No edema.  Skin:    General: Skin is warm and dry.  Neurological:     General: No focal deficit present.     Mental Status: She is alert and oriented to person,  place, and time.     Motor: No weakness.     Coordination: Coordination normal.  Psychiatric:        Mood and Affect: Mood normal.     ED Results / Procedures / Treatments   Labs (all labs ordered are listed, but only abnormal results are displayed) Labs Reviewed  SARS CORONAVIRUS 2 BY RT PCR  ETHANOL  PROTIME-INR  APTT  CBC  DIFFERENTIAL  COMPREHENSIVE METABOLIC PANEL  RAPID URINE DRUG SCREEN, HOSP PERFORMED  URINALYSIS, ROUTINE W REFLEX MICROSCOPIC  CBG MONITORING, ED    EKG EKG Interpretation Date/Time:  Sunday November 20 2022 11:00:49 EDT Ventricular Rate:  79 PR Interval:  140 QRS Duration:  86 QT Interval:  400 QTC Calculation: 458 R Axis:   7  Text Interpretation: Normal sinus rhythm artifact. no acute ischemic appearance. No previous ECGs available normal Confirmed by Arby Barrette [phone]) on 11/20/2022 2:13:19 PM  Radiology CT HEAD CODE STROKE WO CONTRAST  Addendum Date: 11/20/2022   ADDENDUM REPORT:  11/20/2022 11:27 ADDENDUM: Study discussed by telephone with Dr. Lebron Conners Hazem Kenner on 11/20/2022 at 1122 hours. Electronically Signed   By: Odessa Fleming M.D.   On: 11/20/2022 11:27   Result Date: 11/20/2022 CLINICAL DATA:  Code stroke. 67 year old female with sudden severe headache. EXAM: CT HEAD WITHOUT CONTRAST TECHNIQUE: Contiguous axial images were obtained from the base of the skull through the vertex without intravenous contrast. RADIATION DOSE REDUCTION: This exam was performed according to the departmental dose-optimization program which includes automated exposure control, adjustment of the mA and/or kV according to patient size and/or use of iterative reconstruction technique. COMPARISON:  None Available. FINDINGS: Brain: Cerebral volume is within normal limits for age. No midline shift, ventriculomegaly, mass effect, evidence of mass lesion, intracranial hemorrhage or evidence of cortically based acute infarction. Gray-white matter differentiation is within normal limits throughout the brain. Vascular: No suspicious intracranial vascular hyperdensity. Mild Calcified atherosclerosis at the skull base. Skull: Negative. Sinuses/Orbits: Visualized paranasal sinuses and mastoids are clear. Other: No acute orbit or scalp soft tissue finding. ASPECTS [facility] Stroke Program Early CT Score) Total score (0-10 with 10 being normal): 10 IMPRESSION: Normal for age noncontrast Head CT.  ASPECTS 10. Electronically Signed: By: Odessa Fleming M.D. On: 11/20/2022 11:13    Procedures Procedures   CRITICAL CARE Performed by: Arby Barrette   Total critical care time: 30 minutes  Critical care time was exclusive of separately billable procedures and treating other patients.  Critical care was necessary to treat or prevent imminent or life-threatening deterioration.  Critical care was time spent personally by me on the following activities: development of treatment plan with patient and/or surrogate as well as nursing,  discussions with consultants, evaluation of patient's response to treatment, examination of patient, obtaining history from patient or surrogate, ordering and performing treatments and interventions, ordering and review of laboratory studies, ordering and review of radiographic studies, pulse oximetry and re-evaluation of patient's condition.  Medications Ordered in ED Medications  aspirin EC tablet 81 mg (81 mg Oral Given 11/20/22 1259)  LORazepam (ATIVAN) injection 1 mg (has no administration in time range)  prochlorperazine (COMPAZINE) injection 10 mg (10 mg Intravenous Given 11/20/22 1355)  sodium chloride 0.9 % bolus 1,000 mL (1,000 mLs Intravenous New Bag/Given 11/20/22 1358)    ED Course/ Medical Decision Making/ A&P                             Medical  Decision Making Amount and/or Complexity of Data Reviewed Labs: ordered. Radiology: ordered.  Risk Prescription drug management.  Presents as outlined with sudden severe headache and speech deficit.  10 AM onset.  Code stroke initiated and teleneuro evaluated.  Patient did not have syncopal episode.  On arrival vital signs are stable.  She is mildly hypertensive but not severe hypertension.  No focal deficits identified airway stable.  [place] diagnosis includes ischemic or hemorrhagic stroke\complex migraine\tension headache.  CT scan interpreted by ideology negative for any acute findings.  No identified intracerebral hemorrhage.  Basic metabolic panel normal including LFTs.  CBC normal with normal differential.  Consult: Dr. Otelia Limes evaluated the patient by telemetry neurology.  NIH 0. Needs to go to [facility] ED for MRI. Tx with 1L NS and Compazine 10mg  for possible complex migraine.  14: 10 is alert in no distress.  She reports headache has improved.  She still has dull frontal aching headache.  Patient is getting Compazine per recommendations by Dr. Otelia Limes for possible complex migraine.  She is also getting fluid resuscitation with  normal saline.  Neurologic exam is intact.  Motor strength 5\5.  Normal coordination and cerebellar exam.  PLan will be for transfer to [facility] for MRI to rule out stroke.  Will also add MRI angio for acute severe headache without any prior typical migraines to rule out aneurysm.  Currently patient is clinically alert and well in appearance and stable for transfer.  Dr. Marlene Bast accepts for ED to ED transfer to complete stroke evaluation.        Final Clinical Impression(s) / ED Diagnoses Final diagnoses:  Bad headache  Speech disturbance, unspecified type  Paresthesia    Rx / DC Orders ED Discharge Orders     None         Arby Barrette, MD 11/20/22 1420

## 2022-11-20 NOTE — ED Notes (Signed)
ED Provider at bedside. 

## 2022-11-20 NOTE — Consult Note (Addendum)
[facility] TeleNeurology Consult Services    Date of Service:  11/20/2022      Metrics: Last Known Well: 1000 Symptoms: As per HPI.  Patient is not a candidate for thrombolytic. NIHSS 0  Location of the provider: [facility]  Location of the patient: [facility] Pre-Morbid Modified Rankin Scale: 0 Time Code Stroke Page received:  1112 Time neurologist arrived:  1117 Time NIHSS completed: 1140    This consult was provided via telemedicine with 2-way video and audio communication. The patient/family was informed that care would be provided in this way and agreed to receive care in this manner.   ED Physician notified of diagnostic impression and management plan at: 11:44 AM   Assessment: 67 year old female with acute onset of difficulty speaking in the context of a severe headache. LKN 10:00 AM.  She experienced abrupt onset of a severe headache with associated difficulty talking as well as numbness to her fingertips bilaterally. She describes the headache as 8/10, throbbing and frontal, without nausea/vomiting, photophobia, osmophobia or sonophobia. Her speech deficit consisted of inability to speak in complete sentences as well as word-finding difficulty. Code Stroke was called in the ED. At the time of Neurology evaluation, she had no objective speech deficits, but husband and patient both felt that speech was slowed and still not back to her normal baseline.  - Exam reveals no focal neurological deficit. NIHSS 0.  - CT head: Normal for age noncontrast Head CT. ASPECTS 10.  - DDx for presentation includes acute small volume SAH not visible on CT, a small left temporal acute stroke and complicated migraine.    Recommendations: - MRI brain - CTA of head and neck - TTE - Cardiac telemetry - 1 L NS bolus followed by 10 mg IV Compazine. Assess for headache improvement.  - HgbA1c, fasting lipid panel - PT consult, OT consult, Speech consult -  ASA 81 mg po qam, first dose now.  - Permissive HTN x 24 hours - Frequent neuro checks - NPO until passes stroke swallow screen      ------------------------------------------------------------------------------   History of Present Illness: The patient is a 67 year old female with a PMHx of depression and hypercholesterolemia, as well as a remote history of ophthalmic migraine who presents to the ED with acute onset of difficulty speaking in the context of a severe headache. LKN 10:00 AM when she was at church. She then experienced abrupt onset of a severe headache with associated difficulty talking as well as numbness to her fingertips bilaterally. She describes the headache as 8/10, throbbing and frontal, without nausea/vomiting, photophobia, osmophobia or sonophobia. When her husband helped her ambulate out of the church to their car, her gait was felt to be unstable, requiring assistance.   Her speech deficit consisted of inability to speak in complete sentences as well as word-finding difficulty.      Past Medical History: Past Medical History:  Diagnosis Date   Depression   Hypercholesterolemia    Past Surgical History: Past Surgical History:  Procedure Laterality Date   AUGMENTATION MAMMAPLASTY       Medications:  No current facility-administered medications on file prior to encounter.   Current Outpatient Medications on File Prior to Encounter  Medication Sig Dispense Refill   atorvastatin (LIPITOR) 40 MG tablet TK 1 T PO  QD  11   meloxicam (MOBIC) 15 MG tablet Take 1 tablet (15 mg total) by mouth daily. 30 tablet 2   methocarbamol (ROBAXIN) 500  MG tablet Take 1 tablet (500 mg total) by mouth 2 (two) times daily. 20 tablet 0   predniSONE (STERAPRED UNI-PAK 21 TAB) 10 MG (21) TBPK tablet Take by mouth daily. Take 6 tabs by mouth daily  for 2 days, then 5 tabs for 2 days, then 4 tabs for 2 days, then 3 tabs for 2 days, 2 tabs for 2 days, then 1 tab by mouth daily for 2 days  42 tablet 0   venlafaxine XR (EFFEXOR-XR) 75 MG 24 hr capsule TK ONE C PO  QD.  5        Social History: Never smoker.    Family History:  Reviewed in Epic   ROS: As per HPI    Anticoagulant use:  None   Antiplatelet use: None   Examination:    BP (!) 166/83 (BP Location: Right Arm)   Pulse 76   Temp 98 F (36.7 C) (Oral)   Resp 17   SpO2 99%     1A: Level of Consciousness - 0 1B: Ask Month and Age - 0 1C: Blink Eyes & Squeeze Hands - 0 2: Test Horizontal Extraocular Movements - 0 3: Test Visual Fields - 0 4: Test Facial Palsy (Use Grimace if Obtunded) - 0 5A: Test Left Arm Motor Drift - 0 5B: Test Right Arm Motor Drift - 0 6A: Test Left Leg Motor Drift - 0 6B: Test Right Leg Motor Drift - 0 7: Test Limb Ataxia (FNF/Heel-Shin) - 0 8: Test Sensation -  0 9: Test Language/Aphasia - 0 10: Test Dysarthria - Severe Dysarthria: 0 11: Test Extinction/Inattention - Extinction to bilateral simultaneous stimulation 0   NIHSS Score: 0     Patient/Family was informed the Neurology Consult would occur via TeleHealth consult by way of interactive audio and video telecommunications and consented to receiving care in this manner.   Patient is being evaluated for possible acute neurologic impairment and high pretest probability of imminent or life-threatening deterioration. I spent total of 40 minutes providing care to this patient, including time for face to face visit via telemedicine, review of medical records, imaging studies and discussion of findings with providers, the patient and/or family.   Electronically signed: Dr. Caryl Pina

## 2022-11-21 ENCOUNTER — Encounter: Payer: Self-pay | Admitting: Neurology

## 2022-12-13 ENCOUNTER — Ambulatory Visit (INDEPENDENT_AMBULATORY_CARE_PROVIDER_SITE_OTHER): Payer: Medicare Other | Admitting: Neurology

## 2022-12-13 ENCOUNTER — Encounter: Payer: Self-pay | Admitting: Neurology

## 2022-12-13 VITALS — BP 156/84 | HR 82 | Ht 62.0 in | Wt 120.0 lb

## 2022-12-13 DIAGNOSIS — G43809 Other migraine, not intractable, without status migrainosus: Secondary | ICD-10-CM

## 2022-12-13 MED ORDER — NURTEC 75 MG PO TBDP: ORAL_TABLET | ORAL | Status: AC

## 2022-12-13 NOTE — Patient Instructions (Signed)
If you have similar spells again, you may take Nurtec 75mg  as needed.  Please let me know if this helps you and I can send a prescription for this.

## 2022-12-13 NOTE — Progress Notes (Signed)
[facility] Clinic Note - Initial Visit   Date: 12/13/2022   Brittany Perry MRN: [id number] DOB: 05-24-1955   Dear Dr. Criss Alvine:  Thank you for your kind referral of Brittany Perry for consultation of complicated migraine. Although her history is well known to you, please allow [place] to reiterate it for the purpose of our medical record. The patient was accompanied to the clinic by husband who also provides collateral information.     Brittany Perry is a 67 y.o. right-handed female with hyperlipidemia and depression presenting for evaluation of complicated migraine.   IMPRESSION/PLAN: Complex migraine manifesting with vision changes, expressive aphasia, and headache.  MRI/A head was personally viewed and is normal.  Discussed that her spells was not consistent with TIA or stroke.    - If symptoms recur, she was given samples of Nurtec to take as needed.  If headaches are responsive, I will be happy to send a prescription to her pharmacy.   Return to clinic as needed  ------------------------------------------------------------- History of present illness: On 11/20/22, she developed vision changes, described as skewed, followed by difficulty expressing words and slurred speech, and severe bifrontal headache.  Due to concern of stroke, she had MRI/A brain which was normal. Her symptoms resolved after she was given headache cocktail.  She has not had recurrence.  Of note, her COVID test was positive in the ER.  She was asymptomatic.   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  They are very rare and had maybe 12 spells over the past 20 years.    She is a retired TEFL teacher. Nonsmoker and does not drink alcohol.    Out-side paper records, electronic medical record, and images have been reviewed where available and summarized as:  MRI/A head 11/20/2022: 1. No acute intracranial abnormality. 2. Normal  intracranial MRA.  Past Medical History:  Diagnosis Date   Depression     Past Surgical History:  Procedure Laterality Date   AUGMENTATION MAMMAPLASTY       Medications:  Outpatient Encounter Medications as of 12/13/2022  Medication Sig Note   atorvastatin (LIPITOR) 40 MG tablet TK 1 T PO  QD 03/26/2015: Received from: External Pharmacy   venlafaxine XR (EFFEXOR-XR) 75 MG 24 hr capsule TK ONE C PO  QD. 03/26/2015: Received from: External Pharmacy   [DISCONTINUED] meloxicam (MOBIC) 15 MG tablet Take 1 tablet (15 mg total) by mouth daily.    [DISCONTINUED] methocarbamol (ROBAXIN) 500 MG tablet Take 1 tablet (500 mg total) by mouth 2 (two) times daily.    [DISCONTINUED] predniSONE (STERAPRED UNI-PAK 21 TAB) 10 MG (21) TBPK tablet Take by mouth daily. Take 6 tabs by mouth daily  for 2 days, then 5 tabs for 2 days, then 4 tabs for 2 days, then 3 tabs for 2 days, 2 tabs for 2 days, then 1 tab by mouth daily for 2 days    No facility-administered encounter medications on file as of 12/13/2022.    Allergies:  Allergies  Allergen Reactions   Codeine Rash    hives   Penicillins Rash    Family History: Family History  Problem Relation Age of Onset   Heart disease Mother    Hypertension Mother    Emphysema Maternal Grandfather    Stroke Maternal Grandfather     Social History: Social History   Tobacco Use   Smoking status: Never   Smokeless tobacco: Never  Substance Use Topics  Alcohol use: Not Currently   Drug use: Never   Social History   Social History Narrative   Are you right handed or left handed? Right Handed   Are you currently employed ? No   What is your current occupation?   Do you live at home alone? No    Who lives with you? Husband    What type of home do you live in: 1 story or 2 story? Two story home        Vital Signs:  BP (!) 156/84   Pulse 82   Ht 5\' 2"  (1.575 m)   Wt 120 lb (54.4 kg)   SpO2 98%   BMI 21.95 kg/m    Neurological  Exam: MENTAL STATUS including orientation to time, place, person, recent and remote memory, attention span and concentration, language, and fund of knowledge is normal.  Speech is not dysarthric.  CRANIAL NERVES: II:  No visual field defects.     III-IV-VI: Pupils equal round and reactive to light.  Normal conjugate, extra-ocular eye movements in all directions of gaze.  No nystagmus.  No ptosis.   V:  Normal facial sensation.    VII:  Normal facial symmetry and movements.   VIII:  Normal hearing and vestibular function.   IX-X:  Normal palatal movement.   XI:  Normal shoulder shrug and head rotation.   XII:  Normal tongue strength and range of motion, no deviation or fasciculation.  MOTOR:  No atrophy, fasciculations or abnormal movements.  No pronator drift.   Upper Extremity:  Right  Left  Deltoid  5/5   5/5   Biceps  5/5   5/5   Triceps  5/5   5/5   Wrist extensors  5/5   5/5   Wrist flexors  5/5   5/5   Finger extensors  5/5   5/5   Finger flexors  5/5   5/5   Dorsal interossei  5/5   5/5   Abductor pollicis  5/5   5/5   Tone (Ashworth scale)  0  0   Lower Extremity:  Right  Left  Hip flexors  5/5   5/5   Knee flexors  5/5   5/5   Knee extensors  5/5   5/5   Dorsiflexors  5/5   5/5   Plantarflexors  5/5   5/5   Toe extensors  5/5   5/5   Toe flexors  5/5   5/5   Tone (Ashworth scale)  0  0   MSRs:                                           Right        Left brachioradialis 2+  2+  biceps 2+  2+  triceps 2+  2+  patellar 2+  2+  ankle jerk 2+  2+  plantar response down  down   SENSORY:  Normal and symmetric perception of light touch, pinprick, vibration, and proprioception.    COORDINATION/GAIT: Normal finger-to- nose-finger.  Intact rapid alternating movements bilaterally.  Able to rise from a chair without using arms.  Gait narrow based and stable. Tandem and stressed gait intact.     Thank you for allowing me to participate in patient's care.  If I can answer  any additional questions, I would be pleased to do so.  Sincerely,    Shelbe Haglund K. Allena Katz, DO

## 2023-06-14 ENCOUNTER — Encounter (INDEPENDENT_AMBULATORY_CARE_PROVIDER_SITE_OTHER): Payer: Medicare Other | Admitting: Ophthalmology

## 2023-07-24 ENCOUNTER — Encounter (INDEPENDENT_AMBULATORY_CARE_PROVIDER_SITE_OTHER): Payer: Self-pay

## 2023-07-24 ENCOUNTER — Encounter (INDEPENDENT_AMBULATORY_CARE_PROVIDER_SITE_OTHER): Admitting: Ophthalmology

## 2023-08-24 ENCOUNTER — Encounter (INDEPENDENT_AMBULATORY_CARE_PROVIDER_SITE_OTHER): Admitting: Ophthalmology

## 2023-08-24 DIAGNOSIS — H43813 Vitreous degeneration, bilateral: Secondary | ICD-10-CM | POA: Diagnosis not present

## 2023-08-24 DIAGNOSIS — H353132 Nonexudative age-related macular degeneration, bilateral, intermediate dry stage: Secondary | ICD-10-CM

## 2024-02-22 ENCOUNTER — Other Ambulatory Visit: Payer: Self-pay | Admitting: Obstetrics and Gynecology

## 2024-02-22 DIAGNOSIS — Z1231 Encounter for screening mammogram for malignant neoplasm of breast: Secondary | ICD-10-CM

## 2024-02-27 ENCOUNTER — Other Ambulatory Visit: Payer: Self-pay | Admitting: Orthopaedic Surgery

## 2024-02-27 DIAGNOSIS — M19021 Primary osteoarthritis, right elbow: Secondary | ICD-10-CM

## 2024-02-29 ENCOUNTER — Ambulatory Visit
Admission: RE | Admit: 2024-02-29 | Discharge: 2024-02-29 | Disposition: A | Discharge status: Home / Self Care | Source: Ambulatory Visit | Attending: Orthopaedic Surgery | Admitting: Orthopaedic Surgery

## 2024-02-29 DIAGNOSIS — M19021 Primary osteoarthritis, right elbow: Secondary | ICD-10-CM

## 2024-03-15 ENCOUNTER — Ambulatory Visit
Admission: RE | Admit: 2024-03-15 | Discharge: 2024-03-15 | Disposition: A | Discharge status: Home / Self Care | Source: Ambulatory Visit | Attending: Obstetrics and Gynecology | Admitting: Obstetrics and Gynecology

## 2024-03-15 DIAGNOSIS — Z1231 Encounter for screening mammogram for malignant neoplasm of breast: Secondary | ICD-10-CM

## 2024-08-23 ENCOUNTER — Encounter (INDEPENDENT_AMBULATORY_CARE_PROVIDER_SITE_OTHER): Admitting: Ophthalmology
